# Patient Record
Sex: Male | Born: 1975 | Race: White | Hispanic: No | Marital: Married | Smoking: Former smoker
Health system: Southern US, Community
[De-identification: ages and names within clinical notes are randomized; demographics above are authoritative.]

## PROBLEM LIST (undated history)

## (undated) DIAGNOSIS — D179 Benign lipomatous neoplasm, unspecified: Secondary | ICD-10-CM

## (undated) DIAGNOSIS — Z6825 Body mass index (BMI) 25.0-25.9, adult: Secondary | ICD-10-CM

## (undated) DIAGNOSIS — R079 Chest pain, unspecified: Secondary | ICD-10-CM

## (undated) DIAGNOSIS — K219 Gastro-esophageal reflux disease without esophagitis: Secondary | ICD-10-CM

## (undated) DIAGNOSIS — I10 Essential (primary) hypertension: Secondary | ICD-10-CM

## (undated) DIAGNOSIS — E7800 Pure hypercholesterolemia, unspecified: Secondary | ICD-10-CM

## (undated) DIAGNOSIS — T7840XA Allergy, unspecified, initial encounter: Secondary | ICD-10-CM

## (undated) DIAGNOSIS — F419 Anxiety disorder, unspecified: Secondary | ICD-10-CM

## (undated) DIAGNOSIS — G588 Other specified mononeuropathies: Secondary | ICD-10-CM

## (undated) HISTORY — PX: HERNIA REPAIR: SHX51

## (undated) HISTORY — DX: Other specified mononeuropathies: G58.8

## (undated) HISTORY — DX: Pure hypercholesterolemia, unspecified: E78.00

## (undated) HISTORY — DX: Allergy, unspecified, initial encounter: T78.40XA

## (undated) HISTORY — DX: Chest pain, unspecified: R07.9

## (undated) HISTORY — DX: Body mass index (BMI) 25.0-25.9, adult: Z68.25

## (undated) HISTORY — DX: Gastro-esophageal reflux disease without esophagitis: K21.9

## (undated) HISTORY — DX: Essential (primary) hypertension: I10

## (undated) HISTORY — DX: Anxiety disorder, unspecified: F41.9

---

## 2004-10-10 ENCOUNTER — Ambulatory Visit: Payer: Self-pay | Admitting: Family Medicine

## 2004-11-11 ENCOUNTER — Ambulatory Visit: Payer: Self-pay | Admitting: Family Medicine

## 2004-11-27 ENCOUNTER — Ambulatory Visit: Payer: Self-pay | Admitting: Family Medicine

## 2016-02-15 ENCOUNTER — Other Ambulatory Visit: Payer: Self-pay | Admitting: Urology

## 2016-02-15 DIAGNOSIS — N50811 Right testicular pain: Secondary | ICD-10-CM

## 2016-02-22 ENCOUNTER — Ambulatory Visit
Admission: RE | Admit: 2016-02-22 | Discharge: 2016-02-22 | Disposition: A | Discharge status: Home / Self Care | Payer: BC Managed Care – PPO | Source: Ambulatory Visit | Attending: Urology | Admitting: Urology

## 2016-02-22 DIAGNOSIS — N50811 Right testicular pain: Secondary | ICD-10-CM

## 2016-10-23 ENCOUNTER — Other Ambulatory Visit: Payer: Self-pay | Admitting: Urology

## 2016-10-23 DIAGNOSIS — D3000 Benign neoplasm of unspecified kidney: Secondary | ICD-10-CM

## 2016-10-25 ENCOUNTER — Ambulatory Visit (HOSPITAL_COMMUNITY)
Admission: RE | Admit: 2016-10-25 | Discharge: 2016-10-25 | Disposition: A | Discharge status: Home / Self Care | Payer: BC Managed Care – PPO | Source: Ambulatory Visit | Attending: Urology | Admitting: Urology

## 2016-10-25 ENCOUNTER — Encounter (HOSPITAL_COMMUNITY): Payer: Self-pay

## 2016-10-25 DIAGNOSIS — D3000 Benign neoplasm of unspecified kidney: Secondary | ICD-10-CM

## 2016-10-30 ENCOUNTER — Other Ambulatory Visit: Payer: Self-pay | Admitting: Urology

## 2016-10-30 DIAGNOSIS — N50811 Right testicular pain: Secondary | ICD-10-CM

## 2016-11-03 ENCOUNTER — Ambulatory Visit: Payer: Self-pay | Admitting: Surgery

## 2016-11-03 NOTE — H&P (Signed)
Sergio Moore 11/03/2016 10:22 AM Location: [facility] Patient #: [id number] DOB: [birth year] Married / Language: Cleophus Molt / Race: White Male  History of Present Illness Adin Hector MD; 11/03/2016 1:49 PM) The patient is a 41 year old male who presents with inguinal pain. Note for "Inguinal pain": Patient sent for surgical evaluation by urologist Dr. Irine Seal. [facility]. Concern for chronic right groin pain.  Pleasant 41 year old male. Works as a Emergency planning/management officer. He underwent laparoscopic repair of symptomatic bilateral inguinal hernias by Dr Kendell Bane in 2016 Gantt know location on the on the left, but right apparently was indirect inguinal according to the operative report. Sounds like they used a Bard preformed mesh. Titanium spiral tacks were used to hold the meshes in place.   Patient has had no pain or symptoms on the left side. However, he struggles with moderately severe chronic right groin pain ever since surgery. Describes it is rather sharp in his groin radiating to his testicle. NO relief with Ice / Heat. He is tried numerous nonsteroidals including Mobic for almost 2 months at a time without much help. Was sent to physical therapy with [facility]. Urology evaluation to rule out epididymitis or other problems. Has had local injections he recalls at least 4 times along the ilioinguinal nerve. He cannot tolerate amitriptyline due to sleepiness and gabapentin due to sleepiness and nightmares. No help with muscle relaxants. Tried to avoid use of narcotics but occasionally uses tramadol. Testicle very sensitive. Based on concerns he wished to get a second opinion on management of this. Sent for surgical evaluation.  Does not smoke. No chronic back pain. Usually moves his bowels every day. Walks rather regularly. Promoted to surgeon so 75% of his work does desk work. Still he occasionally has to be on the field.  Worse when he has prolonged driving or sitting episodes. Also cannot lie down flat secondary the right groin stretching and hurting. Again no improvement with physical therapy or other interventions above.   Past Surgical History (April Staton, [place]; 11/03/2016 10:23 AM) Laparoscopic Inguinal Hernia Surgery Bilateral.  Diagnostic Studies History (April Staton, [place]; 11/03/2016 10:23 AM) Colonoscopy never  Allergies (April Staton, CMA; 11/03/2016 10:24 AM) No Known Drug Allergies 11/03/2016  Medication History (April Staton, CMA; 11/03/2016 10:25 AM) ALPRAZolam (1MG  Tablet, Oral) Active. TraMADol HCl (50MG  Tablet, Oral) Active. LamoTRIgine (100MG  Tablet, Oral) Active. Medications Reconciled  Social History (April Staton, CMA; 11/03/2016 10:23 AM) Alcohol use Occasional alcohol use. Caffeine use Coffee. No drug use Tobacco use Former smoker.  Family History (April Staton, [place]; 11/03/2016 10:23 AM) Diabetes Mellitus Father, Mother. Thyroid problems Father.  Other Problems (April Staton, CMA; 11/03/2016 10:23 AM) Anxiety Disorder Back Pain Hemorrhoids Inguinal Hernia     Review of Systems (April Staton CMA; 11/03/2016 10:23 AM) General Not Present- Appetite Loss, Chills, Fatigue, Fever, Night Sweats, Weight Gain and Weight Loss. Skin Not Present- Change in Wart/Mole, Dryness, Hives, Jaundice, New Lesions, Non-Healing Wounds, Rash and Ulcer. HEENT Present- Ringing in the Ears. Not Present- Earache, Hearing Loss, Hoarseness, Nose Bleed, Oral Ulcers, Seasonal Allergies, Sinus Pain, Sore Throat, Visual Disturbances, Wears glasses/contact lenses and Yellow Eyes. Respiratory Not Present- Bloody sputum, Chronic Cough, Difficulty Breathing, Snoring and Wheezing. Breast Not Present- Breast Mass, Breast Pain, Nipple Discharge and Skin Changes. Cardiovascular Not Present- Chest Pain, Difficulty Breathing Lying Down, Leg Cramps, Palpitations, Rapid Heart Rate, Shortness of Breath and  Swelling of Extremities. Gastrointestinal Present- Hemorrhoids. Not Present- Abdominal Pain, Bloating, Bloody  Stool, Change in Bowel Habits, Chronic diarrhea, Constipation, Difficulty Swallowing, Excessive gas, Gets full quickly at meals, Indigestion, Nausea, Rectal Pain and Vomiting. Male Genitourinary Present- Change in Urinary Stream. Not Present- Blood in Urine, Frequency, Impotence, Nocturia, Painful Urination, Urgency and Urine Leakage. Musculoskeletal Present- Back Pain. Not Present- Joint Pain, Joint Stiffness, Muscle Pain, Muscle Weakness and Swelling of Extremities. Neurological Not Present- Decreased Memory, Fainting, Headaches, Numbness, Seizures, Tingling, Tremor, Trouble walking and Weakness. Psychiatric Present- Anxiety. Not Present- Bipolar, Change in Sleep Pattern, Depression, Fearful and Frequent crying. Endocrine Not Present- Cold Intolerance, Excessive Hunger, Hair Changes, Heat Intolerance, Hot flashes and New Diabetes. Hematology Present- Gland problems. Not Present- Blood Thinners, Easy Bruising, Excessive bleeding, HIV and Persistent Infections.  Vitals (April Staton CMA; 11/03/2016 10:25 AM) 11/03/2016 10:25 AM Weight: 217.38 lb Height: 72in Body Surface Area: 2.21 m Body Mass Index: 29.48 kg/m  Temp.: 98.75F(Oral)  Pulse: 71 (Regular)  P.OX: 98% (Room air) BP: 132/88 (Sitting, Left Arm, Standard)      Physical Exam Adin Hector MD; 11/03/2016 1:50 PM)  General Mental Status-Alert. General Appearance-Not in acute distress, Not Sickly. Orientation-Oriented X3. Hydration-Well hydrated. Voice-Normal.  Integumentary Global Assessment Upon inspection and palpation of skin surfaces of the - Axillae: non-tender, no inflammation or ulceration, no drainage. and Distribution of scalp and body hair is normal. General Characteristics Temperature - normal warmth is noted.  Head and Neck Head-normocephalic, atraumatic with no lesions or palpable  masses. Face Global Assessment - atraumatic, no absence of expression. Neck Global Assessment - no abnormal movements, no bruit auscultated on the right, no bruit auscultated on the left, no decreased range of motion, non-tender. Trachea-midline. Thyroid Gland Characteristics - non-tender.  Eye Eyeball - Left-Extraocular movements intact, No Nystagmus. Eyeball - Right-Extraocular movements intact, No Nystagmus. Cornea - Left-No Hazy. Cornea - Right-No Hazy. Sclera/Conjunctiva - Left-No scleral icterus, No Discharge. Sclera/Conjunctiva - Right-No scleral icterus, No Discharge. Pupil - Left-Direct reaction to light normal. Pupil - Right-Direct reaction to light normal.  ENMT Ears Pinna - Left - no drainage observed, no generalized tenderness observed. Right - no drainage observed, no generalized tenderness observed. Nose and Sinuses External Inspection of the Nose - no destructive lesion observed. Inspection of the nares - Left - quiet respiration. Right - quiet respiration. Mouth and Throat Lips - Upper Lip - no fissures observed, no pallor noted. Lower Lip - no fissures observed, no pallor noted. Nasopharynx - no discharge present. Oral Cavity/Oropharynx - Tongue - no dryness observed. Oral Mucosa - no cyanosis observed. Hypopharynx - no evidence of airway distress observed.  Chest and Lung Exam Inspection Movements - Normal and Symmetrical. Accessory muscles - No use of accessory muscles in breathing. Palpation Palpation of the chest reveals - Non-tender. Auscultation Breath sounds - Normal and Clear.  Cardiovascular Auscultation Rhythm - Regular. Murmurs & Other Heart Sounds - Auscultation of the heart reveals - No Murmurs and No Systolic Clicks.  Abdomen Inspection Inspection of the abdomen reveals - No Visible peristalsis and No Abnormal pulsations. Umbilicus - No Bleeding, No Urine drainage. Palpation/Percussion Palpation and Percussion of the abdomen  reveal - Soft, Non Tender, No Rebound tenderness, No Rigidity (guarding) and No Cutaneous hyperesthesia. Note: Abdomen soft. Nontender, nondistended. No guarding. No diastasis. No umbilical nor other hernias  Male Genitourinary Sexual Maturity Tanner 5 - Adult hair pattern and Adult penile size and shape. Note: Very sensitive along the right external ring & pubic along cord & nerves. Very sensitive on right testicle as well. Some  symptoms and sensitivity superior medially to the anterior superior iliac spine on the right close to where the ilioinguinal nerve injections took place.   No inguinal hernias. Normal external genitalia. Epididymi, testes, and spermatic cords normal without any masses.  Peripheral Vascular Upper Extremity Inspection - Left - No Cyanotic nailbeds, Not Ischemic. Right - No Cyanotic nailbeds, Not Ischemic.  Neurologic Neurologic evaluation reveals -normal attention span and ability to concentrate, able to name objects and repeat phrases. Appropriate fund of knowledge , normal sensation and normal coordination. Mental Status Affect - not angry, not paranoid. Cranial Nerves-Normal Bilaterally. Gait-Normal.  Neuropsychiatric Mental status exam performed with findings of-able to articulate well with normal speech/language, rate, volume and coherence, thought content normal with ability to perform basic computations and apply abstract reasoning and no evidence of hallucinations, delusions, obsessions or homicidal/suicidal ideation.  Musculoskeletal Global Assessment Spine, Ribs and Pelvis - no instability, subluxation or laxity. Right Upper Extremity - no instability, subluxation or laxity.  Lymphatic Head & Neck  General Head & Neck Lymphatics: Bilateral - Description - No Localized lymphadenopathy. Axillary  General Axillary Region: Bilateral - Description - No Localized lymphadenopathy. Femoral & Inguinal  Generalized Femoral & Inguinal Lymphatics:  Left - Description - No Localized lymphadenopathy. Right - Description - No Localized lymphadenopathy.    Assessment & Plan Adin Hector MD; 11/03/2016 1:52 PM)  GROIN PAIN, CHRONIC, RIGHT (R10.31) Impression: Severe right groin pain that seems to focus right at the genitofemoral and ilioinguinal nerves. Most likely due to metal tacks with mesh repair.  There are no easy solutions here.  Discussed with my colleagues, Dr. Rosendo Gros and Dr. Brantley Stage. The patient has exhausted his options with trial of anti-inflammatories, muscle relaxants, neuropathic pain medications. He cannot tolerate the latter. Pelvic floor physical therapy. No improvement with local nerve injections at least 4 times. No evidence of hernia recurrence by CAT scan. No evidence of urological problems on that side  The only other option left is to explore laparoscopically vs open. Most likely a TAPP vs TEP approach. Locate and excise the mesh & untwist/remove all tacks on that side. We the left side alone as it is asymptomatic. Replace with a thin sheet of mesh without tacks. Triple neurectomy lap versus open assisted. Give a chance to help release a trapped or chronically irritated nerve  The biggest issue is that it may not solve his problems or make things worse. However, he has exhausted other options with interventions by numerous specialists. He would like to proceed.  PREOP - ING HERNIA - ENCOUNTER FOR PREOPERATIVE EXAMINATION FOR GENERAL SURGICAL PROCEDURE (Z01.818)  Current Plans You are being scheduled for surgery- Our schedulers will call you.  You should hear from our office's scheduling department within 5 working days about the location, date, and time of surgery. We try to make accommodations for patient's preferences in scheduling surgery, but sometimes the OR schedule or the surgeon's schedule prevents [place] from making those accommodations.  If you have not heard from our office [phone]) in 5 working  days, call the office and ask for your surgeon's nurse.  If you have other questions about your diagnosis, plan, or surgery, call the office and ask for your surgeon's nurse.  Written instructions provided The anatomy & physiology of the abdominal wall and pelvic floor was discussed. The pathophysiology of hernias in the inguinal and pelvic region was discussed. Natural history risks such as progressive enlargement, pain, incarceration, and strangulation was discussed. Contributors to complications such as smoking,  obesity, diabetes, prior surgery, etc were discussed.  I feel the risks of no intervention will lead to serious problems that outweigh the operative risks; therefore, I recommended surgery to reduce and repair the hernia. I explained laparoscopic techniques with possible need for an open approach. I noted usual use of mesh to patch and/or buttress hernia repair  Risks such as bleeding, infection, abscess, need for further treatment, heart attack, death, and other risks were discussed. I noted a fair to good likelihood this will help address the problem. He understands this may not help and may even worsen. However he has exhausted other options. Goals of post-operative recovery were discussed as well. Possibility that this will not correct all symptoms was explained. I stressed the importance of low-impact activity, aggressive pain control, avoiding constipation, & not pushing through pain to minimize risk of post-operative chronic pain or injury. Possibility of reherniation was discussed. We will work to minimize complications.  An educational handout further explaining the pathology & treatment options was given as well. Questions were answered. The patient expresses understanding & wishes to proceed with surgery.  Pt Education - Pamphlet Given - Laparoscopic Hernia Repair: discussed with patient and provided information. Pt Education - [facility] Pain Control (Linh Johannes) Pt  Education - [facility] Hernia Post-Op HCI (Jhair Witherington): discussed with patient and provided information.

## 2016-11-04 ENCOUNTER — Other Ambulatory Visit: Payer: Self-pay | Admitting: Urology

## 2016-11-04 ENCOUNTER — Ambulatory Visit (HOSPITAL_COMMUNITY)
Admission: RE | Admit: 2016-11-04 | Discharge: 2016-11-04 | Disposition: A | Discharge status: Home / Self Care | Payer: BC Managed Care – PPO | Source: Ambulatory Visit | Attending: Urology | Admitting: Urology

## 2016-11-04 ENCOUNTER — Other Ambulatory Visit (HOSPITAL_COMMUNITY): Payer: BC Managed Care – PPO

## 2016-11-04 DIAGNOSIS — N50811 Right testicular pain: Secondary | ICD-10-CM | POA: Diagnosis present

## 2016-11-04 DIAGNOSIS — D3000 Benign neoplasm of unspecified kidney: Secondary | ICD-10-CM | POA: Diagnosis present

## 2016-11-04 DIAGNOSIS — N281 Cyst of kidney, acquired: Secondary | ICD-10-CM | POA: Insufficient documentation

## 2016-11-04 MED ORDER — GADOBENATE DIMEGLUMINE 529 MG/ML IV SOLN
20.0000 mL | Freq: Once | INTRAVENOUS | Status: AC | PRN
  Administered 2016-11-04: 19 mL via INTRAVENOUS

## 2016-12-15 NOTE — Patient Instructions (Addendum)
Sergio Moore  12/15/2016   Your procedure is scheduled on: 12/18/16  Report to [facility] Main  Entrance            Take [place]  elevators to 3rd floor to  [place] at    [place] AM.     Call this number if you have problems the morning of surgery [phone]    Remember: ONLY 1 PERSON MAY GO WITH YOU TO SHORT STAY TO GET  READY MORNING OF [place].  Do not eat food or drink liquids :After Midnight.     Take these medicines the morning of surgery with A SIP OF WATER: zantac, xanax, lamotrigine (lamictal)                                You may not have any metal on your body including hair pins and              piercings  Do not wear jewelry,  lotions, powders or perfumes, deodorant                          Men may shave face and neck.   Do not bring valuables to the hospital. [place].  Contacts, dentures or bridgework may not be worn into surgery.      Patients discharged the day of surgery will not be allowed to drive home.  Name and phone number of your driver:  Special Instructions: N/A              Please read over the following fact sheets you were given: _____________________________________________________________________             [facility] - Preparing for Surgery Before surgery, you can play an important role.  Because skin is not sterile, your skin needs to be as free of germs as possible.  You can reduce the number of germs on your skin by washing with CHG (chlorahexidine gluconate) soap before surgery.  CHG is an antiseptic cleaner which kills germs and bonds with the skin to continue killing germs even after washing. Please DO NOT use if you have an allergy to CHG or antibacterial soaps.  If your skin becomes reddened/irritated stop using the CHG and inform your nurse when you arrive at Short Stay. Do not shave (including legs and underarms) for at least 48 hours prior to  the first CHG shower.  You may shave your face/neck. Please follow these instructions carefully:  1.  Shower with CHG Soap the night before surgery and the  morning of Surgery.  2.  If you choose to wash your hair, wash your hair first as usual with your  normal  shampoo.  3.  After you shampoo, rinse your hair and body thoroughly to remove the  shampoo.                           4.  Use CHG as you would any other liquid soap.  You can apply chg directly  to the skin and wash  Gently with a scrungie or clean washcloth.  5.  Apply the CHG Soap to your body ONLY FROM THE NECK DOWN.   Do not use on face/ open                           Wound or open sores. Avoid contact with eyes, ears mouth and genitals (private parts).                       Wash face,  Genitals (private parts) with your normal soap.             6.  Wash thoroughly, paying special attention to the area where your surgery  will be performed.  7.  Thoroughly rinse your body with warm water from the neck down.  8.  DO NOT shower/wash with your normal soap after using and rinsing off  the CHG Soap.                9.  Pat yourself dry with a clean towel.            10.  Wear clean pajamas.            11.  Place clean sheets on your bed the night of your first shower and do not  sleep with pets. Day of Surgery : Do not apply any lotions/deodorants the morning of surgery.  Please wear clean clothes to the hospital/surgery center.  FAILURE TO FOLLOW THESE INSTRUCTIONS MAY RESULT IN THE CANCELLATION OF YOUR SURGERY PATIENT SIGNATURE_________________________________  NURSE SIGNATURE__________________________________  ________________________________________________________________________

## 2016-12-16 ENCOUNTER — Encounter (HOSPITAL_COMMUNITY)
Admission: RE | Admit: 2016-12-16 | Discharge: 2016-12-16 | Disposition: A | Discharge status: Home / Self Care | Payer: BC Managed Care – PPO | Source: Ambulatory Visit | Attending: Surgery | Admitting: Surgery

## 2016-12-16 ENCOUNTER — Encounter (HOSPITAL_COMMUNITY): Payer: Self-pay

## 2016-12-16 DIAGNOSIS — Z87891 Personal history of nicotine dependence: Secondary | ICD-10-CM | POA: Diagnosis not present

## 2016-12-16 DIAGNOSIS — G8929 Other chronic pain: Secondary | ICD-10-CM | POA: Diagnosis not present

## 2016-12-16 DIAGNOSIS — R1031 Right lower quadrant pain: Secondary | ICD-10-CM | POA: Diagnosis present

## 2016-12-16 DIAGNOSIS — Z79899 Other long term (current) drug therapy: Secondary | ICD-10-CM | POA: Diagnosis not present

## 2016-12-16 DIAGNOSIS — K660 Peritoneal adhesions (postprocedural) (postinfection): Secondary | ICD-10-CM | POA: Diagnosis not present

## 2016-12-16 DIAGNOSIS — K4090 Unilateral inguinal hernia, without obstruction or gangrene, not specified as recurrent: Secondary | ICD-10-CM | POA: Diagnosis not present

## 2016-12-16 DIAGNOSIS — I10 Essential (primary) hypertension: Secondary | ICD-10-CM | POA: Diagnosis not present

## 2016-12-16 DIAGNOSIS — F419 Anxiety disorder, unspecified: Secondary | ICD-10-CM | POA: Diagnosis not present

## 2016-12-16 HISTORY — DX: Essential (primary) hypertension: I10

## 2016-12-16 HISTORY — DX: Benign lipomatous neoplasm, unspecified: D17.9

## 2016-12-16 LAB — BASIC METABOLIC PANEL
ANION GAP: 8 (ref 5–15)
BUN: 13 mg/dL (ref 6–20)
CHLORIDE: 104 mmol/L (ref 101–111)
CO2: 29 mmol/L (ref 22–32)
Calcium: 9.4 mg/dL (ref 8.9–10.3)
Creatinine, Ser: 1.08 mg/dL (ref 0.61–1.24)
GFR calc non Af Amer: >60 mL/min (ref >60)
GLUCOSE: 99 mg/dL (ref 65–99)
POTASSIUM: 4.7 mmol/L (ref 3.5–5.1)
Sodium: 141 mmol/L (ref 135–145)

## 2016-12-16 LAB — CBC
HEMATOCRIT: 44.9 % (ref 39.0–52.0)
HEMOGLOBIN: 15.6 g/dL (ref 13.0–17.0)
MCH: 30.1 pg (ref 26.0–34.0)
MCHC: 34.7 g/dL (ref 30.0–36.0)
MCV: 86.7 fL (ref 78.0–100.0)
Platelets: 175 10*3/uL (ref 150–400)
RBC: 5.18 MIL/uL (ref 4.22–5.81)
RDW: 12.3 % (ref 11.5–15.5)
WBC: 5.2 10*3/uL (ref 4.0–10.5)

## 2016-12-17 NOTE — Anesthesia Preprocedure Evaluation (Addendum)
Anesthesia Evaluation  Patient identified by MRN, date of birth, ID band Patient awake    Reviewed: Allergy & Precautions, NPO status , Patient's Chart, lab work & pertinent test results  History of Anesthesia Complications Negative for: history of anesthetic complications  Airway Mallampati: II  TM Distance: >3 FB Neck ROM: Full    Dental no notable dental hx. (+) Dental Advisory Given   Pulmonary former smoker,    Pulmonary exam normal        Cardiovascular hypertension, Normal cardiovascular exam     Neuro/Psych negative neurological ROS  negative psych ROS   GI/Hepatic Neg liver ROS,   Endo/Other  negative endocrine ROS  Renal/GU negative Renal ROS     Musculoskeletal negative musculoskeletal ROS (+)   Abdominal   Peds  Hematology negative hematology ROS (+)   Anesthesia Other Findings Day of surgery medications reviewed with the patient.  Reproductive/Obstetrics                            Anesthesia Physical Anesthesia Plan  ASA: II  Anesthesia Plan: General   Post-op Pain Management:    Induction: Intravenous  Airway Management Planned: Oral ETT  Additional Equipment:   Intra-op Plan:   Post-operative Plan: Extubation in OR  Informed Consent: I have reviewed the patients History and Physical, chart, labs and discussed the procedure including the risks, benefits and alternatives for the proposed anesthesia with the patient or authorized representative who has indicated his/her understanding and acceptance.   Dental advisory given  Plan Discussed with: CRNA and Anesthesiologist  Anesthesia Plan Comments:        Anesthesia Quick Evaluation

## 2016-12-18 ENCOUNTER — Ambulatory Visit (HOSPITAL_COMMUNITY): Payer: BC Managed Care – PPO | Admitting: Anesthesiology

## 2016-12-18 ENCOUNTER — Encounter (HOSPITAL_COMMUNITY): Payer: Self-pay | Admitting: *Deleted

## 2016-12-18 ENCOUNTER — Encounter (HOSPITAL_COMMUNITY)
Admission: RE | Disposition: A | Discharge status: Home / Self Care | Payer: Self-pay | Source: Ambulatory Visit | Attending: Surgery

## 2016-12-18 ENCOUNTER — Ambulatory Visit (HOSPITAL_COMMUNITY)
Admission: RE | Admit: 2016-12-18 | Discharge: 2016-12-18 | Disposition: A | Discharge status: Home / Self Care | Payer: BC Managed Care – PPO | Source: Ambulatory Visit | Attending: Surgery | Admitting: Surgery

## 2016-12-18 DIAGNOSIS — Z87891 Personal history of nicotine dependence: Secondary | ICD-10-CM | POA: Insufficient documentation

## 2016-12-18 DIAGNOSIS — Z9889 Other specified postprocedural states: Secondary | ICD-10-CM

## 2016-12-18 DIAGNOSIS — R1031 Right lower quadrant pain: Secondary | ICD-10-CM

## 2016-12-18 DIAGNOSIS — G8929 Other chronic pain: Secondary | ICD-10-CM | POA: Insufficient documentation

## 2016-12-18 DIAGNOSIS — Z8719 Personal history of other diseases of the digestive system: Secondary | ICD-10-CM

## 2016-12-18 DIAGNOSIS — K4090 Unilateral inguinal hernia, without obstruction or gangrene, not specified as recurrent: Secondary | ICD-10-CM | POA: Insufficient documentation

## 2016-12-18 DIAGNOSIS — Z79899 Other long term (current) drug therapy: Secondary | ICD-10-CM | POA: Insufficient documentation

## 2016-12-18 DIAGNOSIS — I10 Essential (primary) hypertension: Secondary | ICD-10-CM | POA: Insufficient documentation

## 2016-12-18 DIAGNOSIS — F419 Anxiety disorder, unspecified: Secondary | ICD-10-CM | POA: Insufficient documentation

## 2016-12-18 DIAGNOSIS — K660 Peritoneal adhesions (postprocedural) (postinfection): Secondary | ICD-10-CM | POA: Insufficient documentation

## 2016-12-18 HISTORY — DX: Personal history of other diseases of the digestive system: Z87.19

## 2016-12-18 HISTORY — PX: OTHER SURGICAL HISTORY: SHX169

## 2016-12-18 HISTORY — PX: LAPAROSCOPIC LYSIS OF ADHESIONS: SHX5905

## 2016-12-18 HISTORY — PX: INGUINAL HERNIA REPAIR: SHX194

## 2016-12-18 HISTORY — PX: LAPAROSCOPIC INGUINAL HERNIA REPAIR: SUR788

## 2016-12-18 HISTORY — DX: Right lower quadrant pain: R10.31

## 2016-12-18 HISTORY — PX: INSERTION OF MESH: SHX5868

## 2016-12-18 SURGERY — REPAIR, HERNIA, INGUINAL, BILATERAL, LAPAROSCOPIC
Anesthesia: General

## 2016-12-18 MED ORDER — BUPIVACAINE LIPOSOME 1.3 % IJ SUSP
20.0000 mL | Freq: Once | INTRAMUSCULAR | Status: AC
  Administered 2016-12-18: 20 mL
  Filled 2016-12-18: qty 20

## 2016-12-18 MED ORDER — CELECOXIB 200 MG PO CAPS
400.0000 mg | ORAL_CAPSULE | ORAL | Status: AC
  Administered 2016-12-18: 400 mg via ORAL
  Filled 2016-12-18: qty 2

## 2016-12-18 MED ORDER — LACTATED RINGERS IV SOLN
INTRAVENOUS | Status: DC | PRN
  Administered 2016-12-18 (×2): via INTRAVENOUS

## 2016-12-18 MED ORDER — CEFAZOLIN SODIUM-DEXTROSE 2-4 GM/100ML-% IV SOLN
2.0000 g | INTRAVENOUS | Status: AC
  Administered 2016-12-18: 2 g via INTRAVENOUS

## 2016-12-18 MED ORDER — FENTANYL CITRATE (PF) 250 MCG/5ML IJ SOLN
INTRAMUSCULAR | Status: AC
  Filled 2016-12-18: qty 5

## 2016-12-18 MED ORDER — SUCCINYLCHOLINE CHLORIDE 200 MG/10ML IV SOSY
PREFILLED_SYRINGE | INTRAVENOUS | Status: AC
  Filled 2016-12-18: qty 10

## 2016-12-18 MED ORDER — TRAMADOL HCL 50 MG PO TABS: 50.0000 mg | ORAL_TABLET | Freq: Four times a day (QID) | ORAL | 0 refills | Status: DC | PRN

## 2016-12-18 MED ORDER — LIDOCAINE 2% (20 MG/ML) 5 ML SYRINGE
INTRAMUSCULAR | Status: AC
  Filled 2016-12-18: qty 10

## 2016-12-18 MED ORDER — ROCURONIUM BROMIDE 50 MG/5ML IV SOSY
PREFILLED_SYRINGE | INTRAVENOUS | Status: DC | PRN
  Administered 2016-12-18 (×3): 10 mg via INTRAVENOUS
  Administered 2016-12-18: 50 mg via INTRAVENOUS
  Administered 2016-12-18 (×6): 10 mg via INTRAVENOUS

## 2016-12-18 MED ORDER — DEXAMETHASONE SODIUM PHOSPHATE 10 MG/ML IJ SOLN
INTRAMUSCULAR | Status: AC
  Filled 2016-12-18: qty 1

## 2016-12-18 MED ORDER — CHLORHEXIDINE GLUCONATE CLOTH 2 % EX PADS: 6.0000 | MEDICATED_PAD | Freq: Once | CUTANEOUS | Status: DC

## 2016-12-18 MED ORDER — ONDANSETRON HCL 4 MG/2ML IJ SOLN
INTRAMUSCULAR | Status: DC | PRN
  Administered 2016-12-18: 4 mg via INTRAVENOUS

## 2016-12-18 MED ORDER — SODIUM CHLORIDE 0.9% FLUSH: 3.0000 mL | INTRAVENOUS | Status: DC | PRN

## 2016-12-18 MED ORDER — ROCURONIUM BROMIDE 50 MG/5ML IV SOSY
PREFILLED_SYRINGE | INTRAVENOUS | Status: AC
  Filled 2016-12-18: qty 5

## 2016-12-18 MED ORDER — OXYCODONE HCL 5 MG PO TABS
5.0000 mg | ORAL_TABLET | ORAL | Status: DC | PRN
  Administered 2016-12-18: 5 mg via ORAL
  Filled 2016-12-18: qty 1

## 2016-12-18 MED ORDER — CEFAZOLIN SODIUM-DEXTROSE 2-4 GM/100ML-% IV SOLN
INTRAVENOUS | Status: AC
  Filled 2016-12-18: qty 100

## 2016-12-18 MED ORDER — ONDANSETRON HCL 4 MG/2ML IJ SOLN
INTRAMUSCULAR | Status: AC
  Filled 2016-12-18: qty 2

## 2016-12-18 MED ORDER — PROPOFOL 10 MG/ML IV BOLUS
INTRAVENOUS | Status: DC | PRN
  Administered 2016-12-18: 200 mg via INTRAVENOUS

## 2016-12-18 MED ORDER — MIDAZOLAM HCL 2 MG/2ML IJ SOLN
INTRAMUSCULAR | Status: DC | PRN
  Administered 2016-12-18: 2 mg via INTRAVENOUS

## 2016-12-18 MED ORDER — BUPIVACAINE HCL (PF) 0.25 % IJ SOLN
INTRAMUSCULAR | Status: AC
  Filled 2016-12-18: qty 60

## 2016-12-18 MED ORDER — DEXAMETHASONE SODIUM PHOSPHATE 10 MG/ML IJ SOLN
INTRAMUSCULAR | Status: DC | PRN
  Administered 2016-12-18: 10 mg via INTRAVENOUS

## 2016-12-18 MED ORDER — ALPRAZOLAM 1 MG PO TABS
1.0000 mg | ORAL_TABLET | Freq: Two times a day (BID) | ORAL | Status: DC | PRN
  Administered 2016-12-18: 1 mg via ORAL
  Filled 2016-12-18 (×2): qty 1

## 2016-12-18 MED ORDER — DEXTROSE 5 % IV SOLN
1000.0000 mg | Freq: Four times a day (QID) | INTRAVENOUS | Status: DC | PRN
  Filled 2016-12-18: qty 10

## 2016-12-18 MED ORDER — PROMETHAZINE HCL 25 MG/ML IJ SOLN: 6.2500 mg | INTRAMUSCULAR | Status: DC | PRN

## 2016-12-18 MED ORDER — SCOPOLAMINE 1 MG/3DAYS TD PT72
1.0000 | MEDICATED_PATCH | TRANSDERMAL | Status: DC
  Administered 2016-12-18: 1.5 mg via TRANSDERMAL
  Filled 2016-12-18: qty 1

## 2016-12-18 MED ORDER — BUPIVACAINE-EPINEPHRINE (PF) 0.5% -1:200000 IJ SOLN
INTRAMUSCULAR | Status: AC
  Filled 2016-12-18: qty 60

## 2016-12-18 MED ORDER — GLYCOPYRROLATE 0.2 MG/ML IV SOSY
PREFILLED_SYRINGE | INTRAVENOUS | Status: AC
  Filled 2016-12-18: qty 5

## 2016-12-18 MED ORDER — BUPIVACAINE HCL (PF) 0.25 % IJ SOLN
INTRAMUSCULAR | Status: DC | PRN
  Administered 2016-12-18: 30 mL

## 2016-12-18 MED ORDER — SUGAMMADEX SODIUM 200 MG/2ML IV SOLN
INTRAVENOUS | Status: AC
  Filled 2016-12-18: qty 2

## 2016-12-18 MED ORDER — OXYCODONE HCL 5 MG PO TABS: 5.0000 mg | ORAL_TABLET | ORAL | 0 refills | Status: DC | PRN

## 2016-12-18 MED ORDER — PROPOFOL 10 MG/ML IV BOLUS
INTRAVENOUS | Status: AC
  Filled 2016-12-18: qty 20

## 2016-12-18 MED ORDER — SUGAMMADEX SODIUM 200 MG/2ML IV SOLN
INTRAVENOUS | Status: DC | PRN
  Administered 2016-12-18: 200 mg via INTRAVENOUS

## 2016-12-18 MED ORDER — FENTANYL CITRATE (PF) 250 MCG/5ML IJ SOLN
INTRAMUSCULAR | Status: DC | PRN
  Administered 2016-12-18: 100 ug via INTRAVENOUS
  Administered 2016-12-18 (×3): 50 ug via INTRAVENOUS

## 2016-12-18 MED ORDER — LIDOCAINE 2% (20 MG/ML) 5 ML SYRINGE
INTRAMUSCULAR | Status: DC | PRN
  Administered 2016-12-18: 100 mg via INTRAVENOUS

## 2016-12-18 MED ORDER — LIDOCAINE 2% (20 MG/ML) 5 ML SYRINGE
INTRAMUSCULAR | Status: DC | PRN
  Administered 2016-12-18: 1.5 mg/kg/h via INTRAVENOUS

## 2016-12-18 MED ORDER — HYDROMORPHONE HCL 2 MG/ML IJ SOLN: 0.2500 mg | INTRAMUSCULAR | Status: DC | PRN

## 2016-12-18 MED ORDER — GLYCOPYRROLATE 0.2 MG/ML IJ SOLN
INTRAMUSCULAR | Status: DC | PRN
  Administered 2016-12-18: 0.4 mg via INTRAVENOUS

## 2016-12-18 MED ORDER — ACETAMINOPHEN 500 MG PO TABS
1000.0000 mg | ORAL_TABLET | ORAL | Status: AC
  Administered 2016-12-18: 1000 mg via ORAL
  Filled 2016-12-18: qty 2

## 2016-12-18 MED ORDER — SODIUM CHLORIDE 0.9% FLUSH: 3.0000 mL | Freq: Two times a day (BID) | INTRAVENOUS | Status: DC

## 2016-12-18 MED ORDER — HYDROMORPHONE HCL 2 MG/ML IJ SOLN
INTRAMUSCULAR | Status: AC
  Filled 2016-12-18: qty 1

## 2016-12-18 MED ORDER — LIDOCAINE 2% (20 MG/ML) 5 ML SYRINGE
INTRAMUSCULAR | Status: AC
  Filled 2016-12-18: qty 5

## 2016-12-18 MED ORDER — NAPROXEN 500 MG PO TABS: 500.0000 mg | ORAL_TABLET | Freq: Two times a day (BID) | ORAL | 1 refills | Status: DC

## 2016-12-18 MED ORDER — GABAPENTIN 300 MG PO CAPS
300.0000 mg | ORAL_CAPSULE | ORAL | Status: AC
  Administered 2016-12-18: 300 mg via ORAL
  Filled 2016-12-18: qty 1

## 2016-12-18 MED ORDER — STERILE WATER FOR IRRIGATION IR SOLN
Status: DC | PRN
  Administered 2016-12-18: 1000 mL

## 2016-12-18 MED ORDER — ACETAMINOPHEN 325 MG PO TABS: 650.0000 mg | ORAL_TABLET | ORAL | Status: DC | PRN

## 2016-12-18 MED ORDER — HYDROMORPHONE HCL 2 MG/ML IJ SOLN
0.2500 mg | INTRAMUSCULAR | Status: AC | PRN
  Administered 2016-12-18 (×8): 0.5 mg via INTRAVENOUS

## 2016-12-18 MED ORDER — SODIUM CHLORIDE 0.9 % IV SOLN: 250.0000 mL | INTRAVENOUS | Status: DC | PRN

## 2016-12-18 MED ORDER — ACETAMINOPHEN 650 MG RE SUPP
650.0000 mg | RECTAL | Status: DC | PRN
  Filled 2016-12-18: qty 1

## 2016-12-18 MED ORDER — MIDAZOLAM HCL 2 MG/2ML IJ SOLN
INTRAMUSCULAR | Status: AC
  Filled 2016-12-18: qty 2

## 2016-12-18 MED ORDER — FENTANYL CITRATE (PF) 100 MCG/2ML IJ SOLN: 25.0000 ug | INTRAMUSCULAR | Status: DC | PRN

## 2016-12-18 MED ORDER — TRAMADOL HCL 50 MG PO TABS
50.0000 mg | ORAL_TABLET | Freq: Once | ORAL | Status: AC
  Administered 2016-12-18: 50 mg via ORAL
  Filled 2016-12-18: qty 1

## 2016-12-18 MED ORDER — METHOCARBAMOL 750 MG PO TABS: 750.0000 mg | ORAL_TABLET | Freq: Four times a day (QID) | ORAL | 2 refills | Status: DC | PRN

## 2016-12-18 SURGICAL SUPPLY — 33 items
APPLIER CLIP 5 13 M/L LIGAMAX5 (MISCELLANEOUS) ×4
CABLE HIGH FREQUENCY MONO STRZ (ELECTRODE) ×4 IMPLANT
CHLORAPREP W/TINT 26ML (MISCELLANEOUS) ×4 IMPLANT
CLIP APPLIE 5 13 M/L LIGAMAX5 (MISCELLANEOUS) ×2 IMPLANT
COVER SURGICAL LIGHT HANDLE (MISCELLANEOUS) ×4 IMPLANT
DECANTER SPIKE VIAL GLASS SM (MISCELLANEOUS) ×4 IMPLANT
DEVICE SECURE STRAP 25 ABSORB (INSTRUMENTS) IMPLANT
DRAPE WARM FLUID 44X44 (DRAPE) ×4 IMPLANT
DRSG TEGADERM 2-3/8X2-3/4 SM (GAUZE/BANDAGES/DRESSINGS) ×4 IMPLANT
DRSG TEGADERM 4X4.75 (GAUZE/BANDAGES/DRESSINGS) ×4 IMPLANT
ELECT REM PT RETURN 15FT ADLT (MISCELLANEOUS) ×4 IMPLANT
GAUZE SPONGE 2X2 8PLY STRL LF (GAUZE/BANDAGES/DRESSINGS) ×2 IMPLANT
GLOVE ECLIPSE 8.0 STRL XLNG CF (GLOVE) ×4 IMPLANT
GLOVE INDICATOR 8.0 STRL GRN (GLOVE) ×4 IMPLANT
GOWN STRL REUS W/TWL XL LVL3 (GOWN DISPOSABLE) ×8 IMPLANT
IRRIG SUCT STRYKERFLOW 2 WTIP (MISCELLANEOUS) ×4
IRRIGATION SUCT STRKRFLW 2 WTP (MISCELLANEOUS) ×2 IMPLANT
KIT BASIN OR (CUSTOM PROCEDURE TRAY) ×4 IMPLANT
MESH ULTRAPRO 6X6 15CM15CM (Mesh General) ×4 IMPLANT
NEEDLE INSUFFLATION 14GA 120MM (NEEDLE) IMPLANT
PAD POSITIONING PINK XL (MISCELLANEOUS) ×4 IMPLANT
SCISSORS LAP 5X35 DISP (ENDOMECHANICALS) ×4 IMPLANT
SLEEVE ADV FIXATION 5X100MM (TROCAR) ×4 IMPLANT
SPONGE GAUZE 2X2 STER 10/PKG (GAUZE/BANDAGES/DRESSINGS) ×2
SUT MNCRL AB 4-0 PS2 18 (SUTURE) ×4 IMPLANT
SUT VIC AB 2-0 SH 27 (SUTURE) ×2
SUT VIC AB 2-0 SH 27X BRD (SUTURE) ×2 IMPLANT
TACKER 5MM HERNIA 3.5CML NAB (ENDOMECHANICALS) IMPLANT
TOWEL OR 17X26 10 PK STRL BLUE (TOWEL DISPOSABLE) ×4 IMPLANT
TRAY LAPAROSCOPIC (CUSTOM PROCEDURE TRAY) ×4 IMPLANT
TROCAR ADV FIXATION 5X100MM (TROCAR) ×4 IMPLANT
TROCAR XCEL BLUNT TIP 100MML (ENDOMECHANICALS) ×4 IMPLANT
TUBING INSUF HEATED (TUBING) ×4 IMPLANT

## 2016-12-18 NOTE — Interval H&P Note (Signed)
History and Physical Interval Note:  12/18/2016 7:25 AM  Sergio Moore  has presented today for surgery, with the diagnosis of chronic right groin pain.  The various methods of treatment have been discussed with the patient and family. After consideration of risks, benefits and other options for treatment, the patient has consented to  Procedure(s): LAPAROSCOPIC RIGHT INGUINAL HERNIA POSSIBLE LEFT (Bilateral) INSERTION OF MESH (Bilateral) LAPAROSCOPIC LYSIS OF ADHESIONS WITH MESH REMOVAL (N/A) as a surgical intervention .  The patient's history has been reviewed, patient examined, no change in status, stable for surgery.  I have reviewed the patient's chart and labs.  Questions were answered to the patient's satisfaction.     Kendell Gammon C.

## 2016-12-18 NOTE — Anesthesia Postprocedure Evaluation (Addendum)
Anesthesia Post Note  Patient: Sergio Moore  Procedure(s) Performed: Procedure(s) (LRB): REDO RIGHT INGUINAL HERNIA, PERIPHERAL NEURECTOMY (Bilateral) INSERTION OF MESH (Bilateral) LAPAROSCOPIC LYSIS OF ADHESIONS WITH MESH AND TACK REMOVAL (N/A)  Patient location during evaluation: PACU Anesthesia Type: General Level of consciousness: sedated Pain management: pain level controlled Vital Signs Assessment: post-procedure vital signs reviewed and stable Respiratory status: spontaneous breathing and respiratory function stable Cardiovascular status: stable Anesthetic complications: no       Last Vitals:  Vitals:   12/18/16 1151 12/18/16 1200  BP:  (!) 147/100  Pulse: 84 81  Resp: 11 10  Temp:  36.8 C    Last Pain:  Vitals:   12/18/16 1200  TempSrc:   PainSc: 4                  Marijayne Rauth DANIEL

## 2016-12-18 NOTE — Discharge Instructions (Signed)
[facility] REPAIR: POST OP INSTRUCTIONS ° °###################################################################### ° °EAT °Gradually transition to a high fiber diet with a fiber supplement over the next few weeks after discharge.  Start with a pureed / full liquid diet (see below) ° °WALK °Walk an hour a day.  Control your pain to do that.   ° °CONTROL PAIN °Control pain so that you can walk, sleep, tolerate sneezing/coughing, go up/down stairs. ° °HAVE A BOWEL MOVEMENT DAILY °Keep your bowels regular to avoid problems.  OK to try a laxative to override constipation.  OK to use an antidairrheal to slow down diarrhea.  Call if not better after 2 tries ° °CALL IF YOU HAVE PROBLEMS/CONCERNS °Call if you are still struggling despite following these instructions. °Call if you have concerns not answered by these instructions ° °###################################################################### ° ° ° °1. DIET: Follow a light bland diet the first 24 hours after arrival home, such as soup, liquids, crackers, etc.  Be sure to include lots of fluids daily.  Avoid fast food or heavy meals as your are more likely to get nauseated.  Eat a low fat the next few days after surgery. °2. Take your usually prescribed home medications unless otherwise directed. °3. PAIN CONTROL: °a. Pain is best controlled by a usual combination of three different methods TOGETHER: °i. Ice/Heat °ii. Over the counter pain medication °iii. Prescription pain medication °b. Most patients will experience some swelling and bruising around the hernia(s) such as the bellybutton, groins, or old incisions.  Ice packs or heating pads (30-60 minutes up to 6 times a day) will help. Use ice for the first few days to help decrease swelling and bruising, then switch to heat to help relax tight/sore spots and speed recovery.  Some people prefer to use ice alone, heat alone, alternating between ice & heat.  Experiment to what works for you.  Swelling and bruising can take  several weeks to resolve.   °c. It is helpful to take an over-the-counter pain medication regularly for the first few weeks.  Choose one of the following that works best for you: °i. Naproxen (Aleve, etc)  Two 220mg tabs twice a day °ii. Ibuprofen (Advil, etc) Three 200mg tabs four times a day (every meal & bedtime) °iii. Acetaminophen (Tylenol, etc) 325-650mg four times a day (every meal & bedtime) °d. A  prescription for pain medication should be given to you upon discharge.  Take your pain medication as prescribed.  °i. If you are having problems/concerns with the prescription medicine (does not control pain, nausea, vomiting, rash, itching, etc), please call us [phone] to see if we need to switch you to a different pain medicine that will work better for you and/or control your side effect better. °ii. If you need a refill on your pain medication, please contact your pharmacy.  They will contact our office to request authorization. Prescriptions will not be filled after 5 pm or on week-ends. °4. Avoid getting constipated.  Between the surgery and the pain medications, it is common to experience some constipation.  Increasing fluid intake and taking a fiber supplement (such as Metamucil, Citrucel, FiberCon, MiraLax, etc) 1-2 times a day regularly will usually help prevent this problem from occurring.  A mild laxative (prune juice, Milk of Magnesia, MiraLax, etc) should be taken according to package directions if there are no bowel movements after 48 hours.   °5. Wash / shower every day.  You may shower over the dressings as they are waterproof.   °6. Remove   your waterproof bandages 5 days after surgery.  You may leave the incision open to air.  You may replace a dressing/Band-Aid to cover the incision for comfort if you wish.  Continue to shower over incision(s) after the dressing is off.    7. ACTIVITIES as tolerated:   a. You may resume regular (light) daily activities beginning the next day--such  as daily self-care, walking, climbing stairs--gradually increasing activities as tolerated.  If you can walk 30 minutes without difficulty, it is safe to try more intense activity such as jogging, treadmill, bicycling, low-impact aerobics, swimming, etc. b. Save the most intensive and strenuous activity for last such as sit-ups, heavy lifting, contact sports, etc  Refrain from any heavy lifting or straining until you are off narcotics for pain control.   c. DO NOT PUSH THROUGH PAIN.  Let pain be your guide: If it hurts to do something, don't do it.  Pain is your body warning you to avoid that activity for another week until the pain goes down. d. You may drive when you are no longer taking prescription pain medication, you can comfortably wear a seatbelt, and you can safely maneuver your car and apply brakes. e. Dennis Bast may have sexual intercourse when it is comfortable.  8. FOLLOW UP in our office a. Please call [facility] at [phone] to set up an appointment to see your surgeon in the office for a follow-up appointment approximately 2-3 weeks after your surgery. b. Make sure that you call for this appointment the day you arrive home to insure a convenient appointment time. 9.  IF YOU HAVE DISABILITY OR FAMILY LEAVE FORMS, BRING THEM TO THE OFFICE FOR PROCESSING.  DO NOT GIVE THEM TO YOUR DOCTOR.  WHEN TO CALL us [phone]: 1. Poor pain control 2. Reactions / problems with new medications (rash/itching, nausea, etc)  3. Fever over 101.5 F (38.5 C) 4. Inability to urinate 5. Nausea and/or vomiting 6. Worsening swelling or bruising 7. Continued bleeding from incision. 8. Increased pain, redness, or drainage from the incision   The clinic staff is available to answer your questions during regular business hours (8:30am-5pm).  Please dont hesitate to call and ask to speak to one of our nurses for clinical concerns.   If you have a medical emergency, go to the nearest emergency room or call  911.  A surgeon from [facility] is always on call at the hospitals in [facility], [place], [place], [place], [place]  [address] ?  [address] [address], [place], [place]   [address] MAIN: [phone] ? TOLL FREE: [phone] ? FAX: [phone] [email or web address]  Managing Pain  ######################################################################   CONTROL PAIN Control pain so that you can walk, sleep, tolerate sneezing/coughing, go up/down stairs.  (Good pain control is not pain free only when lying still, unable to move)  WALK Walk an hour a day.  Control your pain to do that.   HAVE A BOWEL MOVEMENT DAILY Keep your bowels regular to avoid problems.  OK to try a laxative to override constipation.  OK to use an antidairrheal to slow down diarrhea.  Call if not better after 2 tries  CALL IF YOU HAVE PROBLEMS/CONCERNS Call if you are still struggling despite following these instructions. Call if you have concerns not answered by these instructions  ######################################################################    Pain after surgery or related to activity is often due to strain/injury to muscle, tendon, nerves and/or incisions.  This  pain is usually short-term and will improve in a few months.   Many people find it helpful to do the following things TOGETHER to help speed the process of healing and to get back to regular activity more quickly:  1. Avoid heavy physical activity at first a. No lifting greater than 20 pounds at first, then increase to lifting as tolerated over the next few weeks b. Do not push through the pain.  Listen to your body and avoid positions and maneuvers than reproduce the pain.  Wait a few days before trying something more intense c. Walking is okay as tolerated, but go slowly and stop when getting sore.  If you can walk 30 minutes without stopping or pain, you can try more intense  activity (running, jogging, aerobics, cycling, swimming, treadmill, sex, sports, weightlifting, etc ) d. Remember: If it hurts to do it, then dont do it!  2. Take Anti-inflammatory medication a. Choose ONE of the following over-the-counter medications: i.            Acetaminophen 500mg  tabs (Tylenol) 1-2 pills with every meal and just before bedtime (avoid if you have liver problems) ii.            Naproxen 220mg  tabs (ex. Aleve) 1-2 pills twice a day (avoid if you have kidney, stomach, IBD, or bleeding problems) iii. Ibuprofen 200mg  tabs (ex. Advil, Motrin) 3-4 pills with every meal and just before bedtime (avoid if you have kidney, stomach, IBD, or bleeding problems) b. Take with food/snack around the clock for 1-2 weeks i. This helps the muscle and nerve tissues become less irritable and calm down faster  3. Use a Heating pad or Ice/Cold Pack a. 4-6 times a day b. May use warm bath/hottub  or showers  4. Try Gentle Massage and/or Stretching  a. at the area of pain many times a day b. stop if you feel pain - do not overdo it  Try these steps together to help you body heal faster and avoid making things get worse.  Doing just one of these things may not be enough.    If you are not getting better after two weeks or are noticing you are getting worse, contact our office for further advice; we may need to re-evaluate you & see what other things we can do to help.    General Anesthesia, Adult, Care After These instructions provide you with information about caring for yourself after your procedure. Your health care provider may also give you more specific instructions. Your treatment has been planned according to current medical practices, but problems sometimes occur. Call your health care provider if you have any problems or questions after your procedure. What can I expect after the procedure? After the procedure, it is common to have:  Vomiting.  A sore throat.  Mental  slowness. It is common to feel:  Nauseous.  Cold or shivery.  Sleepy.  Tired.  Sore or achy, even in parts of your body where you did not have surgery. Follow these instructions at home: For at least 24 hours after the procedure:   Do not:  Participate in activities where you could fall or become injured.  Drive.  Use heavy machinery.  Drink alcohol.  Take sleeping pills or medicines that cause drowsiness.  Make important decisions or sign legal documents.  Take care of children on your own.  Rest. Eating and drinking   If you vomit, drink water, juice, or soup when you can drink without  vomiting.  Drink enough fluid to keep your urine clear or pale yellow.  Make sure you have little or no nausea before eating solid foods.  Follow the diet recommended by your health care provider. General instructions   Have a responsible adult stay with you until you are awake and alert.  Return to your normal activities as told by your health care provider. Ask your health care provider what activities are safe for you.  Take over-the-counter and prescription medicines only as told by your health care provider.  If you smoke, do not smoke without supervision.  Keep all follow-up visits as told by your health care provider. This is important. Contact a health care provider if:  You continue to have nausea or vomiting at home, and medicines are not helpful.  You cannot drink fluids or start eating again.  You cannot urinate after 8-12 hours.  You develop a skin rash.  You have fever.  You have increasing redness at the site of your procedure. Get help right away if:  You have difficulty breathing.  You have chest pain.  You have unexpected bleeding.  You feel that you are having a life-threatening or urgent problem. This information is not intended to replace advice given to you by your health care provider. Make sure you discuss any questions you have with  your health care provider. Document Released: 11/24/2000 Document Revised: 01/21/2016 Document Reviewed: 08/02/2015 Elsevier Interactive Patient Education  2017 [employer].

## 2016-12-18 NOTE — Progress Notes (Signed)
No relief from tramadol. Requesting stronger meds for here and home. Refuses to take tramadol. Again assisted up to bathroom w/o success. Abd is not distended. Call into Dr Johney Maine. New orders and RX for home obtained

## 2016-12-18 NOTE — OR Nursing (Signed)
Dr. Johney Maine explanted mesh. Mesh has 4 tacks inside and 3 tacks that he removed freely.

## 2016-12-18 NOTE — Anesthesia Procedure Notes (Signed)
Procedure Name: Intubation Date/Time: 12/18/2016 7:39 AM Performed by: Dione Booze Pre-anesthesia Checklist: Emergency Drugs available, Suction available, Patient being monitored and Patient identified Patient Re-evaluated:Patient Re-evaluated prior to inductionOxygen Delivery Method: Circle system utilized Preoxygenation: Pre-oxygenation with 100% oxygen Intubation Type: IV induction Ventilation: Mask ventilation without difficulty Laryngoscope Size: Mac and 4 Grade View: Grade I Tube type: Oral Tube size: 7.5 mm Number of attempts: 1 Airway Equipment and Method: Stylet Placement Confirmation: ETT inserted through vocal cords under direct vision,  positive ETCO2 and breath sounds checked- equal and bilateral Secured at: 22 cm Tube secured with: Tape Dental Injury: Teeth and Oropharynx as per pre-operative assessment

## 2016-12-18 NOTE — H&P (Signed)
12/18/2016  Walker Shadow Sycamore 11/03/2016 10:22 AM Location: [facility] Patient #: [id number] DOB: October 23, 1975 Married / Language: Cleophus Molt / Race: White Male  Patient Care Team: Mateo Flow, MD as PCP - General (Family Medicine) Michael Boston, MD as Consulting Physician (General Surgery) Irine Seal, MD as Attending Physician (Urology) Kendell Bane, MD as Referring Physician (Surgery)   History of Present Illness The patient is a 41 year old male who presents with inguinal pain.   Patient sent for surgical evaluation by urologist Dr. Irine Seal. [facility]. Concern for chronic right groin pain.  Pleasant 41 year old male. Works as a Emergency planning/management officer. He underwent laparoscopic repair of symptomatic bilateral inguinal hernias by Dr Kendell Bane in 2016 [place] know location on the on the left, but right apparently was indirect inguinal according to the operative report. Sounds like they used a Bard preformed mesh. Titanium spiral tacks were used to hold the meshes in place.   Patient has had no pain or symptoms on the left side. However, he struggles with moderately severe chronic right groin pain ever since surgery. Describes it is rather sharp in his groin radiating to his testicle. NO relief with Ice / Heat. He is tried numerous nonsteroidals including Mobic for almost 2 months at a time without much help. Was sent to physical therapy with [facility]. Urology evaluation to rule out epididymitis or other problems. Has had local injections he recalls at least 4 times along the ilioinguinal nerve. He cannot tolerate amitriptyline due to sleepiness and gabapentin due to sleepiness and nightmares. No help with muscle relaxants. Tried to avoid use of narcotics but occasionally uses tramadol. Testicle very sensitive. Based on concerns he wished to get a second opinion on management of this. Sent for surgical evaluation.  Does not smoke.  No chronic back pain. Usually moves his bowels every day. Walks rather regularly. Promoted to surgeon so 75% of his work does desk work. Still he occasionally has to be on the field. Worse when he has prolonged driving or sitting episodes. Also cannot lie down flat secondary the right groin stretching and hurting. Again no improvement with physical therapy or other interventions above.  Ready for surgery     Past Surgical History (April Staton, [place]; 11/03/2016 10:23 AM) Laparoscopic Inguinal Hernia Surgery  Bilateral.  Diagnostic Studies History (April Staton, [place]; 11/03/2016 10:23 AM) Colonoscopy  never  Allergies (April Staton, CMA; 11/03/2016 10:24 AM) No Known Drug Allergies 11/03/2016  Medication History (April Staton, CMA; 11/03/2016 10:25 AM) ALPRAZolam ('1MG'$  Tablet, Oral) Active. TraMADol HCl ('50MG'$  Tablet, Oral) Active. LamoTRIgine ('100MG'$  Tablet, Oral) Active. Medications Reconciled  Social History (April Staton, CMA; 11/03/2016 10:23 AM) Alcohol use  Occasional alcohol use. Caffeine use  Coffee. No drug use  Tobacco use  Former smoker.  Family History (April Staton, [place]; 11/03/2016 10:23 AM) Diabetes Mellitus  Father, Mother. Thyroid problems  Father.  Other Problems (April Staton, CMA; 11/03/2016 10:23 AM) Anxiety Disorder  Back Pain  Hemorrhoids  Inguinal Hernia     Review of Systems (April Staton CMA; 11/03/2016 10:23 AM) General Not Present- Appetite Loss, Chills, Fatigue, Fever, Night Sweats, Weight Gain and Weight Loss. Skin Not Present- Change in Wart/Mole, Dryness, Hives, Jaundice, New Lesions, Non-Healing Wounds, Rash and Ulcer. HEENT Present- Ringing in the Ears. Not Present- Earache, Hearing Loss, Hoarseness, Nose Bleed, Oral Ulcers, Seasonal Allergies, Sinus Pain, Sore Throat, Visual Disturbances, Wears glasses/contact lenses and Yellow Eyes. Respiratory Not Present- Bloody sputum, Chronic Cough, Difficulty Breathing,  Snoring and  Wheezing. Breast Not Present- Breast Mass, Breast Pain, Nipple Discharge and Skin Changes. Cardiovascular Not Present- Chest Pain, Difficulty Breathing Lying Down, Leg Cramps, Palpitations, Rapid Heart Rate, Shortness of Breath and Swelling of Extremities. Gastrointestinal Present- Hemorrhoids. Not Present- Abdominal Pain, Bloating, Bloody Stool, Change in Bowel Habits, Chronic diarrhea, Constipation, Difficulty Swallowing, Excessive gas, Gets full quickly at meals, Indigestion, Nausea, Rectal Pain and Vomiting. Male Genitourinary Present- Change in Urinary Stream. Not Present- Blood in Urine, Frequency, Impotence, Nocturia, Painful Urination, Urgency and Urine Leakage. Musculoskeletal Present- Back Pain. Not Present- Joint Pain, Joint Stiffness, Muscle Pain, Muscle Weakness and Swelling of Extremities. Neurological Not Present- Decreased Memory, Fainting, Headaches, Numbness, Seizures, Tingling, Tremor, Trouble walking and Weakness. Psychiatric Present- Anxiety. Not Present- Bipolar, Change in Sleep Pattern, Depression, Fearful and Frequent crying. Endocrine Not Present- Cold Intolerance, Excessive Hunger, Hair Changes, Heat Intolerance, Hot flashes and New Diabetes. Hematology Present- Gland problems. Not Present- Blood Thinners, Easy Bruising, Excessive bleeding, HIV and Persistent Infections.  Vitals (April Staton CMA; 11/03/2016 10:25 AM) 11/03/2016 10:25 AM Weight: 217.38 lb Height: 72in Body Surface Area: 2.21 m Body Mass Index: 29.48 kg/m  Temp.: 98.84F(Oral)  Pulse: 71 (Regular)  P.OX: 98% (Room air) BP: 132/88 (Sitting, Left Arm, Standard)  BP 134/80   Pulse (!) 55   Temp 97.8 F (36.6 C) (Oral)   Resp 16   Ht '6\' 6"'$  (1.981 m)   Wt 95.3 kg (210 lb)   SpO2 100%   BMI 24.27 kg/m       Physical Exam Adin Hector MD; 11/03/2016 1:50 PM) General Mental Status-Alert. General Appearance-Not in acute distress, Not Sickly. Orientation-Oriented  X3. Hydration-Well hydrated. Voice-Normal.  Integumentary Global Assessment Upon inspection and palpation of skin surfaces of the - Axillae: non-tender, no inflammation or ulceration, no drainage. and Distribution of scalp and body hair is normal. General Characteristics Temperature - normal warmth is noted.  Head and Neck Head-normocephalic, atraumatic with no lesions or palpable masses. Face Global Assessment - atraumatic, no absence of expression. Neck Global Assessment - no abnormal movements, no bruit auscultated on the right, no bruit auscultated on the left, no decreased range of motion, non-tender. Trachea-midline. Thyroid Gland Characteristics - non-tender.  Eye Eyeball - Left-Extraocular movements intact, No Nystagmus. Eyeball - Right-Extraocular movements intact, No Nystagmus. Cornea - Left-No Hazy. Cornea - Right-No Hazy. Sclera/Conjunctiva - Left-No scleral icterus, No Discharge. Sclera/Conjunctiva - Right-No scleral icterus, No Discharge. Pupil - Left-Direct reaction to light normal. Pupil - Right-Direct reaction to light normal.  ENMT Ears Pinna - Left - no drainage observed, no generalized tenderness observed. Right - no drainage observed, no generalized tenderness observed. Nose and Sinuses External Inspection of the Nose - no destructive lesion observed. Inspection of the nares - Left - quiet respiration. Right - quiet respiration. Mouth and Throat Lips - Upper Lip - no fissures observed, no pallor noted. Lower Lip - no fissures observed, no pallor noted. Nasopharynx - no discharge present. Oral Cavity/Oropharynx - Tongue - no dryness observed. Oral Mucosa - no cyanosis observed. Hypopharynx - no evidence of airway distress observed.  Chest and Lung Exam Inspection Movements - Normal and Symmetrical. Accessory muscles - No use of accessory muscles in breathing. Palpation Palpation of the chest reveals -  Non-tender. Auscultation Breath sounds - Normal and Clear.  Cardiovascular Auscultation Rhythm - Regular. Murmurs & Other Heart Sounds - Auscultation of the heart reveals - No Murmurs and No Systolic Clicks.  Abdomen Inspection Inspection of the abdomen reveals -  No Visible peristalsis and No Abnormal pulsations. Umbilicus - No Bleeding, No Urine drainage. Palpation/Percussion Palpation and Percussion of the abdomen reveal - Soft, Non Tender, No Rebound tenderness, No Rigidity (guarding) and No Cutaneous hyperesthesia. Note: Abdomen soft. Nontender, nondistended. No guarding. No diastasis. No umbilical nor other hernias   Male Genitourinary Sexual Maturity Tanner 5 - Adult hair pattern and Adult penile size and shape. Note: Very sensitive along the right external ring & pubic along cord & nerves. Very sensitive on right testicle as well. Some symptoms and sensitivity superior medially to the anterior superior iliac spine on the right close to where the ilioinguinal nerve injections took place.   No inguinal hernias. Normal external genitalia. Epididymi, testes, and spermatic cords normal without any masses.   Peripheral Vascular Upper Extremity Inspection - Left - No Cyanotic nailbeds, Not Ischemic. Right - No Cyanotic nailbeds, Not Ischemic.  Neurologic Neurologic evaluation reveals -normal attention span and ability to concentrate, able to name objects and repeat phrases. Appropriate fund of knowledge , normal sensation and normal coordination. Mental Status Affect - not angry, not paranoid. Cranial Nerves-Normal Bilaterally. Gait-Normal.  Neuropsychiatric Mental status exam performed with findings of-able to articulate well with normal speech/language, rate, volume and coherence, thought content normal with ability to perform basic computations and apply abstract reasoning and no evidence of hallucinations, delusions, obsessions or homicidal/suicidal  ideation.  Musculoskeletal Global Assessment Spine, Ribs and Pelvis - no instability, subluxation or laxity. Right Upper Extremity - no instability, subluxation or laxity.  Lymphatic Head & Neck  General Head & Neck Lymphatics: Bilateral - Description - No Localized lymphadenopathy. Axillary  General Axillary Region: Bilateral - Description - No Localized lymphadenopathy. Femoral & Inguinal  Generalized Femoral & Inguinal Lymphatics: Left - Description - No Localized lymphadenopathy. Right - Description - No Localized lymphadenopathy.    Assessment & Plan   GROIN PAIN, CHRONIC, RIGHT (R10.31) Impression: Severe right groin pain that seems to focus right at the genitofemoral and ilioinguinal nerves. Most likely due to metal tacks with mesh repair.  There are no easy solutions here.  Discussed with my colleagues, Dr. Rosendo Gros and Dr. Brantley Stage. The patient has exhausted his options with trial of anti-inflammatories, muscle relaxants, neuropathic pain medications. He cannot tolerate the latter. Pelvic floor physical therapy. No improvement with local nerve injections at least 4 times. No evidence of hernia recurrence by CAT scan. No evidence of urological problems on that side  The only other option left is to explore laparoscopically vs open. Most likely a TAPP vs TEP approach. Locate and excise the mesh & untwist/remove all tacks on that side. We the left side alone as it is asymptomatic. Replace with a thin sheet of mesh without tacks. Triple neurectomy lap versus open assisted. Give a chance to help release a trapped or chronically irritated nerve  The biggest issue is that it may not solve his problems or make things worse. However, he has exhausted other options with interventions by numerous specialists. He would like to proceed.   PREOP - ING HERNIA - ENCOUNTER FOR PREOPERATIVE EXAMINATION FOR GENERAL SURGICAL PROCEDURE (Z01.818) Current Plans You are being scheduled for  surgery- Our schedulers will call you.  You should hear from our office's scheduling department within 5 working days about the location, date, and time of surgery. We try to make accommodations for patient's preferences in scheduling surgery, but sometimes the OR schedule or the surgeon's schedule prevents [place] from making those accommodations.  If you have not heard from  our office [phone]) in 5 working days, call the office and ask for your surgeon's nurse.  If you have other questions about your diagnosis, plan, or surgery, call the office and ask for your surgeon's nurse.  Written instructions provided The anatomy & physiology of the abdominal wall and pelvic floor was discussed. The pathophysiology of hernias in the inguinal and pelvic region was discussed. Natural history risks such as progressive enlargement, pain, incarceration, and strangulation was discussed. Contributors to complications such as smoking, obesity, diabetes, prior surgery, etc were discussed.  I feel the risks of no intervention will lead to serious problems that outweigh the operative risks; therefore, I recommended surgery to reduce and repair the hernia. I explained laparoscopic techniques with possible need for an open approach. I noted usual use of mesh to patch and/or buttress hernia repair  Risks such as bleeding, infection, abscess, need for further treatment, heart attack, death, and other risks were discussed. I noted a fair to good likelihood this will help address the problem. He understands this may not help and may even worsen. However he has exhausted other options. Goals of post-operative recovery were discussed as well. Possibility that this will not correct all symptoms was explained. I stressed the importance of low-impact activity, aggressive pain control, avoiding constipation, & not pushing through pain to minimize risk of post-operative chronic pain or injury. Possibility of  reherniation was discussed. We will work to minimize complications.  An educational handout further explaining the pathology & treatment options was given as well. Questions were answered. The patient expresses understanding & wishes to proceed with surgery.  Pt Education - Pamphlet Given - Laparoscopic Hernia Repair: discussed with patient and provided information. Pt Education - [facility] Pain Control (Kenyanna Grzesiak) Pt Education - [facility] Hernia Post-Op HCI (Ilana Prezioso): discussed with patient and provided information.  I have re-reviewed the the patient's records, history, medications, and allergies.  I have re-examined the patient.  I again discussed intraoperative plans and goals of post-operative recovery.  The patient agrees to proceed.  Ivin Rosenbloom [id number]  [birth year] [phone]  Patient Care Team: Mateo Flow, MD as PCP - General (Family Medicine) Michael Boston, MD as Consulting Physician (General Surgery) Irine Seal, MD as Attending Physician (Urology) Kendell Bane, MD as Referring Physician (Surgery)  There are no active problems to display for this patient.   Past Medical History:  Diagnosis Date  . Hypertension    hx of  . Multiple lipomas     Past Surgical History:  Procedure Laterality Date  . HERNIA REPAIR     bil groin    Social History   Social History  . Marital status: Married    Spouse name: N/A  . Number of children: N/A  . Years of education: N/A   Occupational History  . Not on file.   Social History Main Topics  . Smoking status: Former Research scientist (life sciences)  . Smokeless tobacco: Former Systems developer    Quit date: 12/17/2011     Comment: 2 years in high school  . Alcohol use No  . Drug use: No  . Sexual activity: Yes   Other Topics Concern  . Not on file   Social History Narrative  . No narrative on file    History reviewed. No pertinent family history.  Current Facility-Administered Medications  Medication Dose Route Frequency Provider Last Rate Last Dose  . ceFAZolin  (ANCEF) 2-4 GM/100ML-% IVPB           . ceFAZolin (ANCEF) IVPB 2g/100 mL  premix  2 g Intravenous On Call to OR Michael Boston, MD      . Chlorhexidine Gluconate Cloth 2 % PADS 6 each  6 each Topical Once Michael Boston, MD       And  . Chlorhexidine Gluconate Cloth 2 % PADS 6 each  6 each Topical Once Michael Boston, MD      . scopolamine (TRANSDERM-SCOP) 1 MG/3DAYS 1.5 mg  1 patch Transdermal Q72H Duane Boston, MD   1.5 mg at 12/18/16 0550   Facility-Administered Medications Ordered in Other Encounters  Medication Dose Route Frequency Provider Last Rate Last Dose  . lactated ringers infusion    Continuous PRN Dione Booze, CRNA         No Known Allergies  BP 134/80   Pulse (!) 55   Temp 97.8 F (36.6 C) (Oral)   Resp 16   Ht '6\' 6"'$  (1.981 m)   Wt 95.3 kg (210 lb)   SpO2 100%   BMI 24.27 kg/m   Labs: Results for orders placed or performed during the hospital encounter of 12/16/16 (from the past 48 hour(s))  CBC     Status: None   Collection Time: 12/16/16  2:00 PM  Result Value Ref Range   WBC 5.2 4.0 - 10.5 K/uL   RBC 5.18 4.22 - 5.81 MIL/uL   Hemoglobin 15.6 13.0 - 17.0 g/dL   HCT 44.9 39.0 - 52.0 %   MCV 86.7 78.0 - 100.0 fL   MCH 30.1 26.0 - 34.0 pg   MCHC 34.7 30.0 - 36.0 g/dL   RDW 12.3 11.5 - 15.5 %   Platelets 175 150 - 400 K/uL  Basic metabolic panel     Status: None   Collection Time: 12/16/16  2:00 PM  Result Value Ref Range   Sodium 141 135 - 145 mmol/L   Potassium 4.7 3.5 - 5.1 mmol/L   Chloride 104 101 - 111 mmol/L   CO2 29 22 - 32 mmol/L   Glucose, Bld 99 65 - 99 mg/dL   BUN 13 6 - 20 mg/dL   Creatinine, Ser 1.08 0.61 - 1.24 mg/dL   Calcium 9.4 8.9 - 10.3 mg/dL   GFR calc non Af Amer >60 >60 mL/min   GFR calc Af Amer >60 >60 mL/min    Comment: (NOTE) The eGFR has been calculated using the CKD EPI equation. This calculation has not been validated in all clinical situations. eGFR's persistently <60 mL/min signify possible Chronic Kidney Disease.     Anion gap 8 5 - 15    Imaging / Studies: No results found.   Adin Hector, M.D., F.A.C.S. Gastrointestinal and Minimally Invasive Surgery [facility] [address] [place], [place] [address] [phone] Main / Paging  12/18/2016 7:25 AM

## 2016-12-18 NOTE — Transfer of Care (Signed)
Immediate Anesthesia Transfer of Care Note  Patient: Sergio Moore  Procedure(s) Performed: Procedure(s): REDO RIGHT INGUINAL HERNIA, PERIPHERAL NEURECTOMY (Bilateral) INSERTION OF MESH (Bilateral) LAPAROSCOPIC LYSIS OF ADHESIONS WITH MESH AND TACK REMOVAL (N/A)  Patient Location: PACU  Anesthesia Type:General  Level of Consciousness: awake, alert , oriented and patient cooperative  Airway & Oxygen Therapy: Patient Spontanous Breathing and Patient connected to face mask oxygen  Post-op Assessment: Report given to RN and Post -op Vital signs reviewed and stable  Post vital signs: Reviewed and stable  Last Vitals:  Vitals:   12/18/16 0525  BP: 134/80  Pulse: (!) 55  Resp: 16  Temp: 36.6 C    Last Pain:  Vitals:   12/18/16 0525  TempSrc: Oral      Patients Stated Pain Goal: 4 (15/86/82 5749)  Complications: No apparent anesthesia complications

## 2016-12-18 NOTE — Op Note (Signed)
12/18/2016  11:04 AM  PATIENT:  Sergio Moore  41 y.o. male  Patient Care Team: Sergio Flow, MD as PCP - General (Family Medicine) Sergio Boston, MD as Consulting Physician (General Surgery) Sergio Seal, MD as Attending Physician (Urology) Sergio Bane, MD as Referring Physician (Surgery)  PRE-OPERATIVE DIAGNOSIS:  chronic right groin pain.  POST-OPERATIVE DIAGNOSIS:  chronic right groin pain.  PROCEDURE:  LAPAROSCOPIC LYSIS OF ADHESIONS x 2 hours REMOVAL OF RIGHT PREPERITONEAL MESH & tacks  TRIPLE PERIPHERAL NEURECTOMY REDO RIGHT INGUINAL HERNIA REPAIR with INSERTION OF MESH   SURGEON:  Sergio Hector, MD  ASSISTANT: RN   ANESTHESIA:   regional and general Ilioinguinal, genitofemoral, and spermatic cord nerve blocks  EBL:  Total I/O In: 1000 [I.V.:1000] Out: 100 [Blood:100]  Delay start of Pharmacological VTE agent (>24hrs) due to surgical blood loss or risk of bleeding:  NO  DRAINS: none   SPECIMEN:  No Specimen  DISPOSITION OF SPECIMEN:  N/A  COUNTS:  YES  PLAN OF CARE: Discharge to home after PACU  PATIENT DISPOSITION:  PACU - hemodynamically stable.  INDICATION: Pleasant active gentleman status post bilateral inguinal hernia appears an outside institution.  [facility].  Patient has had chronic right groin pain since the surgery.  Left side fine.  It is been refractory to numerous interventions including anti-inflammatory neuropathic pain.  Steroid and focal injections.  Urology and general surgical evaluations.  Physical therapy and stretching.  Patient Sergio Moore second opinion.  I offered lap's, possible open exploration.  Plan would be to remove right-sided mesh and any tacks.  Peritoneal repair.  Different sheet of mesh without tacks.  Triple neurectomy of genitofemoral, ilioinguinal, iliohypogastric nerves.  Discussed at length the risks that this may not resolve his pain, could make it worse, hernia recurrence, injury to other organs, testicular  loss, etc.  Patient wishes to proceed with surgery since it is severely debilitating.  The anatomy & physiology of the abdominal wall and pelvic floor was discussed.  The pathophysiology of hernias in the inguinal and pelvic region was discussed.  Natural history risks such as progressive enlargement, pain, incarceration & strangulation was discussed.   Contributors to complications such as smoking, obesity, diabetes, prior surgery, etc were discussed.    I feel the risks of no intervention will lead to serious problems that outweigh the operative risks; therefore, I recommended surgery to reduce and repair the hernia.  I explained laparoscopic techniques with possible need for an open approach.  I noted usual use of mesh to patch and/or buttress hernia repair  Risks such as bleeding, infection, abscess, need for further treatment, heart attack, death, and other risks were discussed.  I noted a good likelihood this will help address the problem.   Goals of post-operative recovery were discussed as well.  Possibility that this will not correct all symptoms was explained.  I stressed the importance of low-impact activity, aggressive pain control, avoiding constipation, & not pushing through pain to minimize risk of post-operative chronic pain or injury. Possibility of reherniation was discussed.  We will work to minimize complications.     An educational handout further explaining the pathology & treatment options was given as well.  Questions were answered.  The patient expresses understanding & wishes to proceed with surgery.  OR FINDINGS: Patient had well incorporated Bard 3-D type mesh.  Contracted posteriorly: Inferior & lateral corner folded back upon itself anteriorly.  Genitofemoral nerve very densely adherent to the inferoposterior corner of the mesh, probable  focus of irritation..  Three clips on the tissues near and perhaps one on the right vas deferens.  The vas deferens very contracted and  atrophic with extremely dense adhesions to the mesh centrally.  Not able to be salvaged on that side.  Seven spiral ProTack type metal tacks noted on the mesh.  Primarily in the suprapubic and retrorectus paramedian region.  Most involved with the right pubic tubercle.  No evidence of hernia recurrence on the left side.  Well incorporated mesh and left alone on the left.  DESCRIPTION:   The patient was identified & brought into the operating room. The patient was positioned supine with arms tucked. SCDs were active during the entire case. The patient underwent general anesthesia without any difficulty.  The abdomen was prepped and draped in a sterile fashion. A Surgical Timeout confirmed our plan.  I made a transverse incision through the inferior umbilical fold.  I made a small transverse nick through the anterior rectus fascia contralateral to the inguinal hernia side and placed a 0-vicryl stitch through the fascia.  I placed a Hasson trocar into the preperitoneal plane.  Entry was clean.  We induced carbon dioxide insufflation. Camera inspection revealed no injury. I used a 34mm angled scope to bluntly free the peritoneum off the infraumbilical anterior abdominal wall.  I created enough of a preperitoneal pocket to place 22mm ports into the right & left mid-abdomen into this preperitoneal cavity.   I focused on freeing the peritoneum off the anterior abdominal wall.'s able to get to a more clean plane laterally along the right flank superior to the iliac crest.  Follow more inferiorly until I counted the superior rim of the Bard 3-D mesh.  The mesh did have excellent incorporation.  Peritoneum was rather thinned out at the level of the internal ring.  I was able to free the peritoneum off the mesh laterally and then come around medially as well.    I freed most of the perotineum off the mesh.  The peritoneum was very thin and densely adherent around the mesh covering the indirect hernia near the  internal ring, so that could not be spared.  I was able to come medially and freed the bladder off the mesh and have that out of the way.  I freed the mesh off the suprapubic, pelvic, femoral, iliac, inguinal region.  I used blunt & focused sharp dissection to free the peritoneum off the flank and down to the pubic rim.  I primarily came around laterally.  Freed the mesh off the retroperitoneum, flank, and anterior abdominal wall.  There was a nerve densely adherent to the mesh on the inferior medial corner .  That seen becoming more from the lateral flank.  Most suspicious for the ilioinguinal nerve.  I carefully freed that nerve off of the contracted mesh.  With further dissection in the lateral half of the mesh off I could see that the posterior lateral edge had flipped back upon itself with a prominent fold.    I freed the medial part of the mesh off the posterior rectus muscle tie came down to the pubis.  Encounter some metal tacks on the posterior right rectus muscle.  I removed a few of them that were densely adherent.  Fall that to the pubic rim.  Returned to mobilize the mesh off the spermatic vessels.  The vas deferens was densely adherent to the mesh.  I could see 35 mm like clips near the vas deferens.  One may be partially across the vas but most seen to be with the tissue next to it, implying the artery along with it.  I worked to try and spare the vas deferens and freeing it off the mesh but it was atrophic scarred and densely adherent, so cannot be spared.  I was able to free the mesh off the iliac vessels and direct space.  This just left the pubic rim.  This was the area where it was most duct.  Gradually freed off the mesh and several tacks with it off the pubic brim.  Freed the mesh off and leaving one little cuff of mesh.  Went back to that cuff and confirmed that there was attack associated with it.  Freddrick March that tack off so I could therefore get that last inferomedial cuff mesh off the  anterior ramus of the pelvis.  With that tacking corner released, I was able to come around the medial inferior corner and free off the medial corner off the left sided mesh.  I do not see any obvious hernia recurrence on the left side he had no complaints left side, so I left the left part mesh alone.  I removed the mesh out the umbilical port  along with the small mesh cuff.  I confirmed that I had intact removal.  I confirmed #4 more tacks within the mesh itself in the suprapubic region.  I counted three more tacks that I had removed separately during dissection.    I did inspection.  I saw no remnant of mesh nor any tacks in the right pubic, inguinal, flank, anterior abdominal wall region.  Did further dissection to identify the iliohypogastric and ilioinguinal nerves coming towards the iliac crest along the iliacus muscle as well as the genitofemoral nerve coming off the medial part of the psoas muscle in the retroperitoneum.  I skeletonize and dissect them as they are coming off the posterior flank before heading to the internal ring/inguinal region.  I freed peritoneum off the spermatic vessels as well.  I came around medially.  Again confirmed the split vas deferens with central atrophy and scarring but healthy at the at the internal ring and retroperitoneum.  I did remove the clips off the vas deferens x 3.  He had a fair amount of oozing during the case but after washed out and inspection, hemostasis was good.  I returned inspection to the nerves to confirm correct anatomy.  I excised the nerves with scissors.  I sent each individual nerve separately.  Iliohypogastric, ilioinguinal, and genitofemoral.  Two of good quality.  The genitofemoral seemed rather poor quality.  That was the most densely adherent.    As anticipated, surgical dissection had been challenged by dense adhesions and poor planes resulting in the ability to spare the right vas deferens.  Also there were peritoneal defects.  Repair  peritoneal defects was done with 2-0 Vicryl minimally invasive intracorporeal suturing using absorbable suture.  I could a hernia defect at the internal ring.  was consistent with the indirect hernia that had been diagnosed on initial surgery.  There is no evidence of any direct spaced inguinal, femoral, nor bturator hernias.  I chose a 15x15 cm sheet of mesh (ultra-lightweight polypropylene = Ultrapro) to patch the hernia.  I cut a sigmoid-shaped slit within a side of the mesh, about 6cm from one edge.  I placed the mesh into the preperitoneal space & laid it as a diamond such that the inferior point had a  6x6 cm corner flap resting in the true pelvis, covering the obturator & femoral foramina.     I allowed the bladder to return to the pubis, this helping tuck the corners of the mesh in the anteriolateral pelvis.  The medial corners overlapped each other across midline cephalad to the pubic rim.   This provided >2 inch coverage around the hernia.  Because the defects well covered and not particularly large and in order to avoid future nerve pain, I did not place any tacks.  I held the hernia sac cephalad & evacuated carbon dioxide.  I closed the infraumbilical wound with 0-vicryl suture at the fascia and 4-0 monocryl stitch at the skin.  Sterile dressings were applied. The patient was extubated & arrived in the PACU in stable condition..  I had discussed postoperative care with the patient in the holding area. I discussed operative findings, updated the patient's status, discussed probable steps to recovery, and gave postoperative recommendations to the patient's family.  Recommendations were made.  Note that the right vas deferens cannot be spared.  Patient's wife said they had on all the children they needed & she was not concerned.  Questions were answered.  They expressed understanding & appreciation.  Sergio Moore, M.D., F.A.C.S. Gastrointestinal and Minimally Invasive Surgery [facility] [address], [place], [place] [address] [phone] Main / Paging

## 2016-12-18 NOTE — Progress Notes (Signed)
Asst up to void w 2 staff members. Patient unsteady on his feet.Voiding unsuccessful

## 2016-12-19 ENCOUNTER — Encounter (HOSPITAL_COMMUNITY): Payer: Self-pay | Admitting: Surgery

## 2017-01-31 NOTE — Addendum Note (Signed)
Addendum  created 01/31/17 0830 by Duane Boston, MD   Sign clinical note

## 2018-08-30 DIAGNOSIS — M4712 Other spondylosis with myelopathy, cervical region: Secondary | ICD-10-CM | POA: Insufficient documentation

## 2018-08-30 HISTORY — PX: OTHER SURGICAL HISTORY: SHX169

## 2018-12-06 IMAGING — MR MR PELVIS W/O CM
14 of 27 series · 21 of 48 positions shown · IV contrast (multihance)
Comparison: CT 10/20/2016

CLINICAL DATA: RIGHT renal lesion. RIGHT inguinal pain radiating to
the testicle.

EXAM:
MRI ABDOMEN AND PELVIS WITHOUT AND WITH CONTRAST
TECHNIQUE: Multiplanar multisequence MR imaging of the abdomen and pelvis was
performed both before and after the administration of intravenous
contrast.
CONTRAST:  19mL MULTIHANCE GADOBENATE DIMEGLUMINE 529 MG/ML IV SOLN

[Series 4: T2 · coronal · 6.0mm · 0.78mm/px · 2 of 34 slices shown (1 of 5)]
[im 1/34]
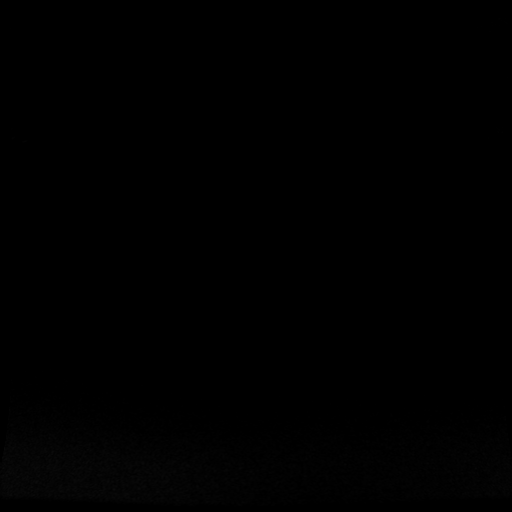
[im 34/34]
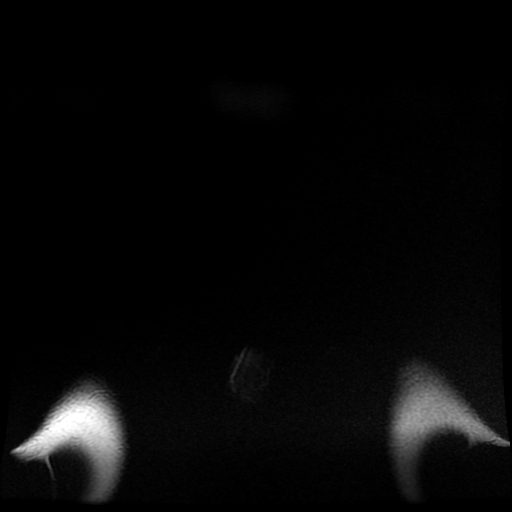

[Series 5: T2 · axial · 5.0mm · 0.53mm/px · z∈[-283,+5]mm · 2 of 49 slices shown (2 of 5)]
[im 1/49]
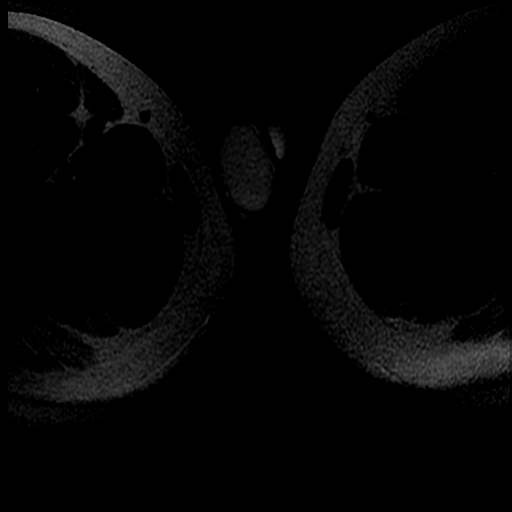
[im 49/49]
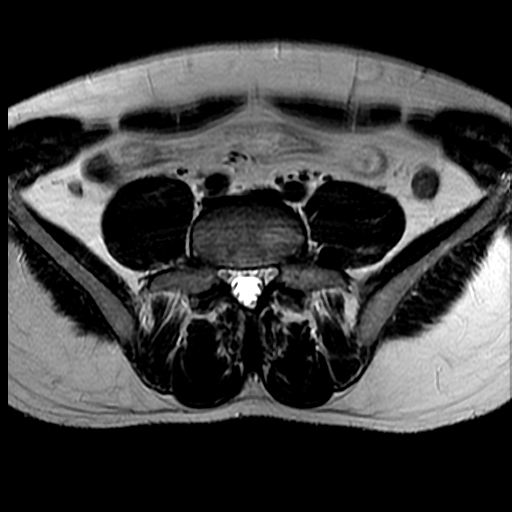

[Series 7: T2 fat-sat · axial · 5.0mm · 0.53mm/px · z∈[-287,+1]mm · 2 of 49 slices shown (1 of 2)]
[im 1/49]
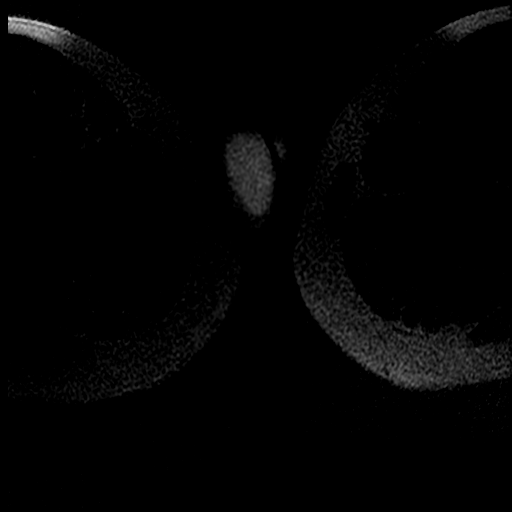
[im 49/49]
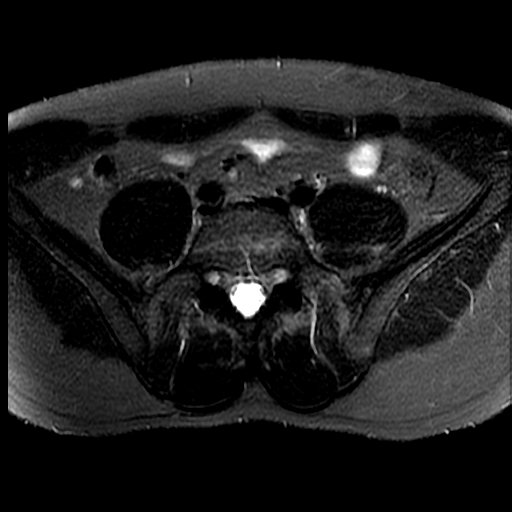

[Series 8: T2 · sagittal · 5.0mm · 0.55mm/px · 2 of 37 slices shown (3 of 5)]
[im 1/37]
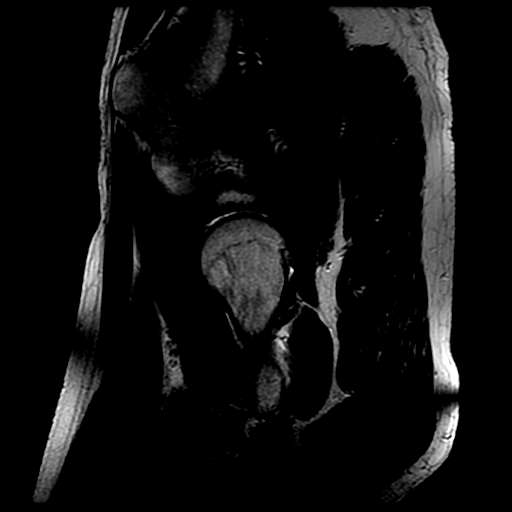
[im 37/37]
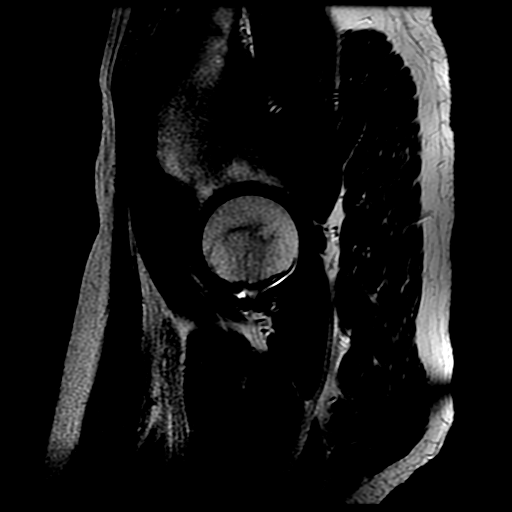

[Series 9: T1 · axial · 5.0mm · 0.53mm/px · z∈[-287,+1]mm · 2 of 49 slices shown]
[im 1/49]
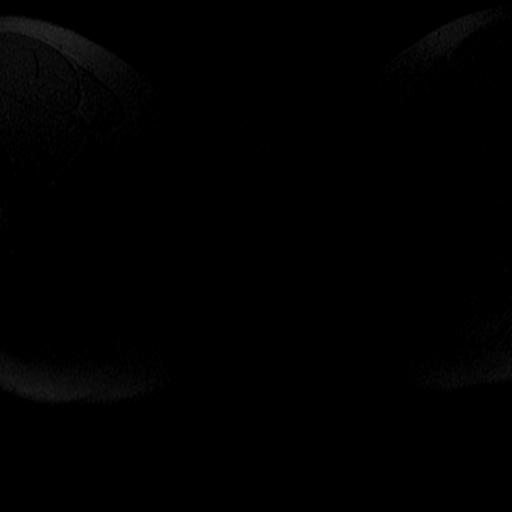
[im 49/49]
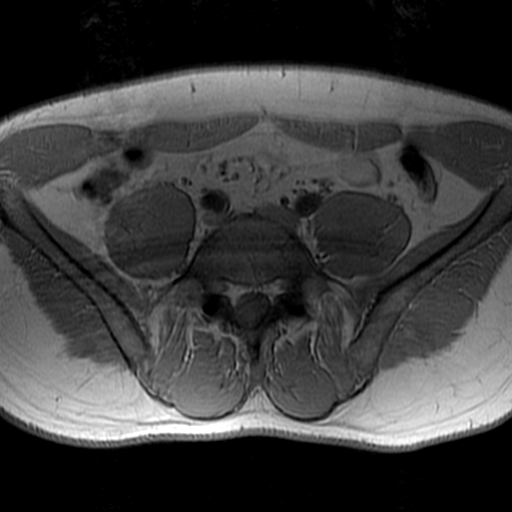

[Series 10: T1 fat-sat · axial · 5.0mm · 0.53mm/px · 1 of 49 slices shown]
[im 1/49]
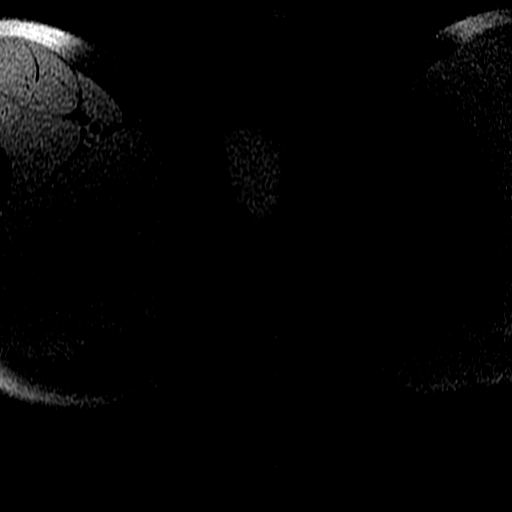

[Series 14: DWI b500 · axial · 6.0mm · 1.48mm/px · z∈[+128,+401]mm · 2 of 72 slices shown]
[im 1/72]
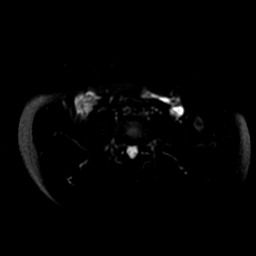
[im 72/72]
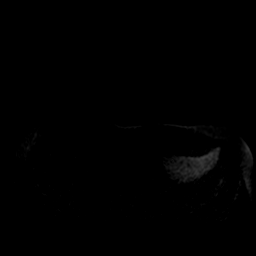

[Series 15: T2 fat-sat · axial · 5.0mm · 0.78mm/px · 1 of 48 slices shown (2 of 2)]
[im 1/48]
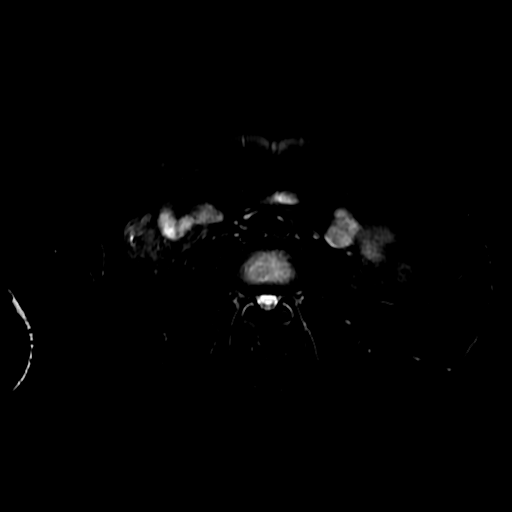

[Series 16: ax dualecho · axial · 5.0mm · 0.78mm/px · z∈[+160,+395]mm · 2 of 96 slices shown]
[im 1/96]
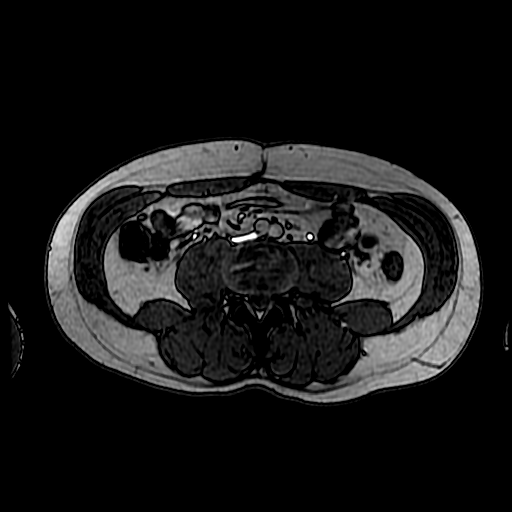
[im 96/96]
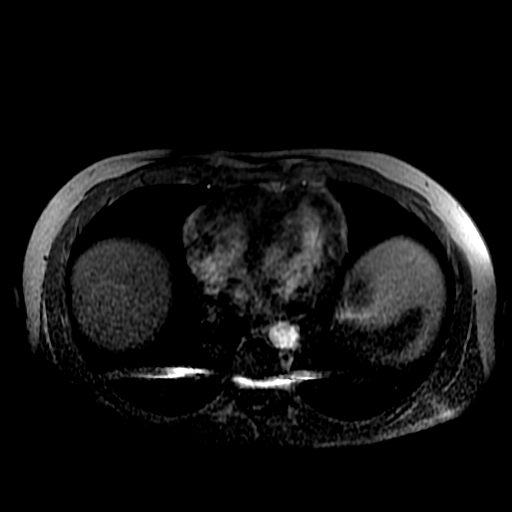

[Series 17: T2 · axial · 5.0mm · 0.78mm/px · 1 of 48 slices shown (4 of 5)]
[im 1/48]
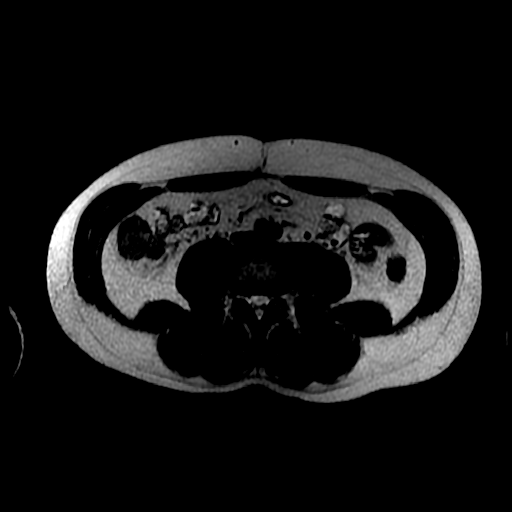

[Series 18: T2 · coronal · 5.0mm · 0.78mm/px · 1 of 40 slices shown (5 of 5)]
[im 1/40]
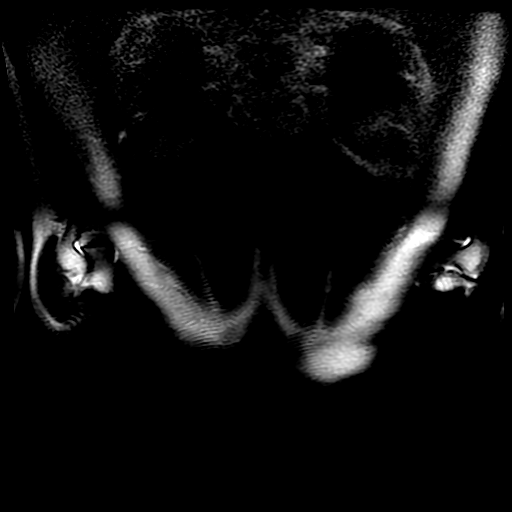

[Series 19: bSSFP · axial · 5.0mm · 0.78mm/px · 1 of 48 slices shown]
[im 1/48]
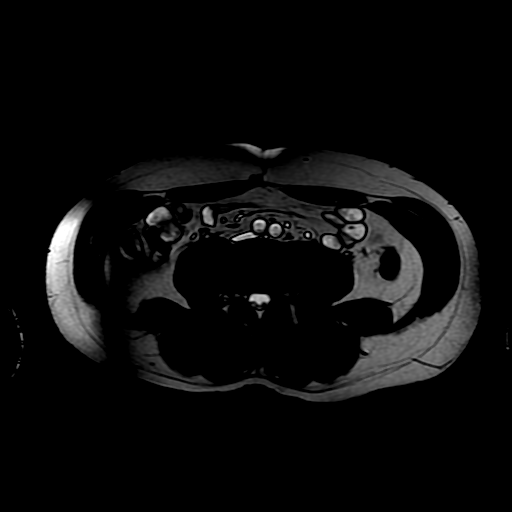

[Series 1400: DWI · axial · 6.0mm · 1.48mm/px · 1 of 36 slices shown]
[im 1/36]
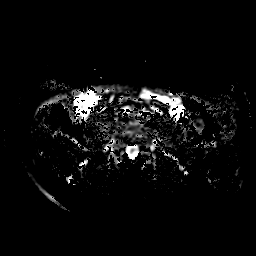

[Series 2000: T1 dynamic · axial · 5.0mm · 0.78mm/px · 1 of 88 slices shown]
[im 1/88]
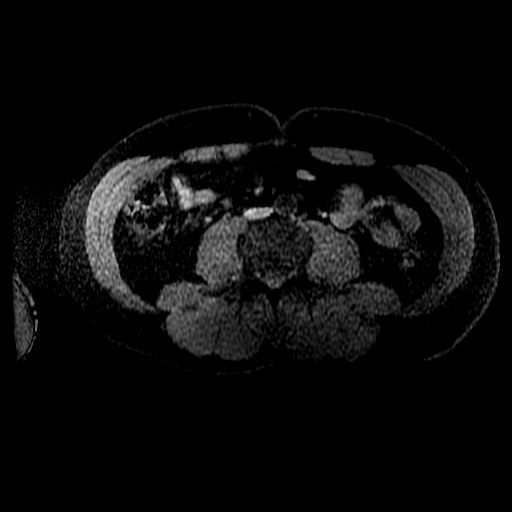

[21 of 48 positions shown; findings below may reference images not displayed]

FINDINGS: COMBINED FINDINGS FOR BOTH MR ABDOMEN AND PELVIS

Lower chest: Lung bases are clear.

Hepatobiliary: No focal hepatic lesion. No biliary duct dilatation.
Gallbladder is normal. Common bile duct is normal.

Pancreas: Pancreas is normal. No ductal dilatation. No pancreatic
inflammation.

Spleen: Normal spleen

Adrenals/urinary tract: Adrenal glands are normal.

Within the upper pole of the LEFT kidney round lesion measuring 19 x
14 mm (image 13, series 15) has high signal intensity on T2 weighted
imaging and corresponds to the indeterminate lesion on comparison
CT. Lesion is predominantly hypointense on T1 weighted imaging.
There is a rim of high signal intensity dependently within the
lesion on T1 weighted series 5000 consistent with small amount
hemorrhage or protein. Postcontrast imaging demonstrates no
enhancing components within this cystic lesion.

Stomach/Bowel: Stomach, and bowel are unremarkable

Vascular/Lymphatic: Abdominal aorta is normal caliber. There is no
retroperitoneal or periportal lymphadenopathy. No pelvic
lymphadenopathy.

Reproductive: Prostate gland is normal

Other: No inguinal hernia. Prior RIGHT inguinal hernia repair
without complication. The testicles are normal on LEFT and RIGHT. No
hydrocele.

Musculoskeletal: No osseous abnormality in the pelvis
IMPRESSION: 1. Bosniak II renal cyst of the LEFT kidney.
2. No explanation for RIGHT lower quadrant pain / inguinal pain. No
complication following hernia repair. Testicles are grossly normal.

## 2019-02-15 ENCOUNTER — Encounter: Payer: Self-pay | Admitting: Gastroenterology

## 2019-02-17 ENCOUNTER — Telehealth (INDEPENDENT_AMBULATORY_CARE_PROVIDER_SITE_OTHER): Payer: BC Managed Care – PPO | Admitting: Gastroenterology

## 2019-02-17 ENCOUNTER — Encounter: Payer: Self-pay | Admitting: Gastroenterology

## 2019-02-17 ENCOUNTER — Other Ambulatory Visit: Payer: Self-pay

## 2019-02-17 VITALS — Ht 74.0 in | Wt 205.0 lb

## 2019-02-17 DIAGNOSIS — R194 Change in bowel habit: Secondary | ICD-10-CM

## 2019-02-17 DIAGNOSIS — Z8719 Personal history of other diseases of the digestive system: Secondary | ICD-10-CM

## 2019-02-17 DIAGNOSIS — R1031 Right lower quadrant pain: Secondary | ICD-10-CM | POA: Diagnosis not present

## 2019-02-17 DIAGNOSIS — G8929 Other chronic pain: Secondary | ICD-10-CM

## 2019-02-17 NOTE — Patient Instructions (Signed)
If you are age 41 or older, your body mass index should be between 23-30. Your Body mass index is 26.32 kg/m. If this is out of the aforementioned range listed, please consider follow up with your Primary Care Provider.  If you are age 71 or younger, your body mass index should be between 19-25. Your Body mass index is 26.32 kg/m. If this is out of the aformentioned range listed, please consider follow up with your Primary Care Provider.   You have been scheduled for a colonoscopy. Please follow written instructions given to you at your visit today.  Please pick up your prep supplies at the pharmacy within the next 1-3 days. If you use inhalers (even only as needed), please bring them with you on the day of your procedure. Your physician has requested that you go to www.startemmi.com and enter the access code given to you at your visit today. This web site gives a general overview about your procedure. However, you should still follow specific instructions given to you by our office regarding your preparation for the procedure.  To help prevent the possible spread of infection to our patients, communities, and staff; we will be implementing the following measures:  As of now we are not allowing any visitors/family members to accompany you to any upcoming appointments with [facility]. If you have any concerns about this please contact our office to discuss prior to the appointment.   Thank you,  Dr. Jackquline Denmark

## 2019-02-17 NOTE — Progress Notes (Signed)
Chief Complaint: Change in stool caliber  Referring Provider:  Mateo Flow, MD      ASSESSMENT AND PLAN;   #1.  Change in bowel habits/change in stool caliber with intestinal gas. #2.  H/O rectal bleeding (resolved) #3.  Chronic right lower quadrant abdominal pain, s/p lap LOA with replacement of old mesh for inguinal hernia repair (Dr Johney Maine)  Plan: - Proceed with colonoscopy with miralax prep. Discussed risks & benefits. (Risks including rare perforation req laparotomy, bleeding after biopsies/polypectomy req blood transfusion, rare chance of missing neoplasms, risks of anesthesia/sedation). Benefits outweigh the risks. Patient agrees to proceed. All the questions were answered.  -He will get routine blood work at Dr. Marella Bile office (part of annual routine physical)   HPI:    Sergio Moore is a 43 y.o. male  With change in stool caliber-stool becoming more flat and pencillike.  One episode of rectal bleeding with blood mixed with the stool.  Has chronic right lower quadrant abdominal pain started after bilateral inguinal hernia repair.  Dr. Johney Maine had to replace the mesh on the right side after LOA.  He continues to have abdominal pain.  Has lost 10 pounds in the last 4 months.  He is an avid runner.  Mom is a patient of ours.  No upper GI symptoms including nausea, heartburn, regurgitation odynophagia or dysphagia.  Has some abdominal bloating and does pass gas which is very foul-smelling.  Denies having any significant diarrhea or constipation but would occasionally alternate between the two.  No fever chills night sweats.  No skin rash.  No family history of Crohn's disease.  [place] in [place], mother is a patient of ours. Past Medical History:  Diagnosis Date  . GERD (gastroesophageal reflux disease)   . Hypertension    hx of  . Multiple lipomas     Past Surgical History:  Procedure Laterality Date  . cervical interior infusion   08/30/2018  . HERNIA REPAIR     bil groin  . INGUINAL HERNIA REPAIR Bilateral 12/18/2016   Procedure: REDO RIGHT INGUINAL HERNIA, PERIPHERAL NEURECTOMY;  Surgeon: Michael Boston, MD;  Location: [facility];  Service: General;  Laterality: Bilateral;  . INSERTION OF MESH Bilateral 12/18/2016   Procedure: INSERTION OF MESH;  Surgeon: Michael Boston, MD;  Location: [facility];  Service: General;  Laterality: Bilateral;  . LAPAROSCOPIC INGUINAL HERNIA REPAIR Right 12/18/2016   Removal of old mesh and placement of new preperitoneal mesh  . LAPAROSCOPIC LYSIS OF ADHESIONS N/A 12/18/2016   Procedure: LAPAROSCOPIC LYSIS OF ADHESIONS WITH MESH AND TACK REMOVAL;  Surgeon: Michael Boston, MD;  Location: [facility];  Service: General;  Laterality: N/A;  . triple neurectomy - laparoscopic Right 12/18/2016    Family History  Problem Relation Age of Onset  . Colon cancer Neg Hx     Social History   Tobacco Use  . Smoking status: Former Research scientist (life sciences)  . Smokeless tobacco: Former Systems developer    Quit date: 12/17/2011  . Tobacco comment: 2 years in high school  Substance Use Topics  . Alcohol use: No  . Drug use: No    Current Outpatient Medications  Medication Sig Dispense Refill  . ALPRAZolam (XANAX) 1 MG tablet Take 1 mg by mouth 2 (two) times daily as needed for anxiety.    . gabapentin (NEURONTIN) 300 MG capsule 2 (two) times a day.    . lamoTRIgine (LAMICTAL) 100 MG tablet Take 100 mg by mouth 2 (two) times daily.    Marland Kitchen  traMADol (ULTRAM) 50 MG tablet Take by mouth.     No current facility-administered medications for this visit.     No Known Allergies  Review of Systems:  Constitutional: Denies fever, chills, diaphoresis, appetite change and fatigue.  HEENT: Denies photophobia, eye pain, redness, hearing loss, ear pain, congestion, sore throat, rhinorrhea, sneezing, mouth sores, neck pain, neck stiffness and tinnitus.   Respiratory: Denies SOB, DOE, cough, chest tightness,  and wheezing.   Cardiovascular: Denies chest  pain, palpitations and leg swelling.  Genitourinary: Denies dysuria, urgency, frequency, hematuria, flank pain and difficulty urinating.  Musculoskeletal: Denies myalgias, back pain, joint swelling, arthralgias and gait problem.  Skin: No rash.  Neurological: Denies dizziness, seizures, syncope, weakness, light-headedness, numbness and headaches.  Hematological: Denies adenopathy. Easy bruising, personal or family bleeding history  Psychiatric/Behavioral: has anxiety or depression     Physical Exam:    Ht 6\' 2"  (1.88 m)   Wt 205 lb (93 kg)   BMI 26.32 kg/m  Filed Weights   02/17/19 1344  Weight: 205 lb (93 kg)   Constitutional:  Well-developed, in no acute distress. Psychiatric: Normal mood and affect. Behavior is normal. televist  Data Reviewed: I have personally reviewed following labs and imaging studies  CBC: CBC Latest Ref Rng & Units 12/16/2016  WBC 4.0 - 10.5 K/uL 5.2  Hemoglobin 13.0 - 17.0 g/dL 15.6  Hematocrit 39.0 - 52.0 % 44.9  Platelets 150 - 400 K/uL 175    CMP: CMP Latest Ref Rng & Units 12/16/2016  Glucose 65 - 99 mg/dL 99  BUN 6 - 20 mg/dL 13  Creatinine 0.61 - 1.24 mg/dL 1.08  Sodium 135 - 145 mmol/L 141  Potassium 3.5 - 5.1 mmol/L 4.7  Chloride 101 - 111 mmol/L 104  CO2 22 - 32 mmol/L 29  Calcium 8.9 - 10.3 mg/dL 9.4   This service was provided via telemedicine.  The patient was located at office.  The provider was located in office.  The patient did consent to this telephone visit and is aware of possible charges through their insurance for this visit.  The patient was referred by Dr. Chancy Milroy.    Time spent on call/coordination of care: 20 min    Carmell Austria, MD 02/17/2019, 3:12 PM  Cc: Mateo Flow, MD

## 2019-02-25 ENCOUNTER — Encounter: Payer: Self-pay | Admitting: Gastroenterology

## 2019-03-07 ENCOUNTER — Telehealth: Payer: Self-pay | Admitting: Gastroenterology

## 2019-03-07 NOTE — Telephone Encounter (Signed)

## 2019-03-08 ENCOUNTER — Ambulatory Visit (AMBULATORY_SURGERY_CENTER): Payer: BC Managed Care – PPO | Admitting: Gastroenterology

## 2019-03-08 ENCOUNTER — Other Ambulatory Visit: Payer: Self-pay

## 2019-03-08 ENCOUNTER — Encounter: Payer: Self-pay | Admitting: Gastroenterology

## 2019-03-08 VITALS — BP 131/84 | HR 72 | Temp 98.9°F | Resp 20 | Ht 74.0 in | Wt 205.0 lb

## 2019-03-08 DIAGNOSIS — K649 Unspecified hemorrhoids: Secondary | ICD-10-CM | POA: Diagnosis not present

## 2019-03-08 DIAGNOSIS — K5730 Diverticulosis of large intestine without perforation or abscess without bleeding: Secondary | ICD-10-CM | POA: Diagnosis not present

## 2019-03-08 DIAGNOSIS — R194 Change in bowel habit: Secondary | ICD-10-CM | POA: Diagnosis present

## 2019-03-08 MED ORDER — SODIUM CHLORIDE 0.9 % IV SOLN: 500.0000 mL | Freq: Once | INTRAVENOUS | Status: DC

## 2019-03-08 MED ORDER — HYDROCORTISONE (PERIANAL) 2.5 % EX CREA
1.0000 | TOPICAL_CREAM | Freq: Two times a day (BID) | CUTANEOUS | 0 refills | Status: AC
Start: 2019-03-08 — End: 2019-03-18

## 2019-03-08 NOTE — Op Note (Signed)
[place] Patient Name: Sergio Moore Procedure Date: 03/08/2019 2:15 PM MRN: [id number] Endoscopist: Jackquline Denmark , MD Age: 43 Referring MD:  Date of Birth: 27-Apr-1976 Gender: Male Account #: [id number] Procedure:                Colonoscopy Indications:              1. Change in bowel habits/change in stool caliber                            with intestinal gas.                           #2. H/O rectal bleeding (resolved)                           #3. Chronic right lower quadrant abdominal pain,                            s/p lap LOA with replacement of old mesh for                            inguinal hernia repair (Dr Johney Maine) Medicines:                Monitored Anesthesia Care Procedure:                Pre-Anesthesia Assessment:                           - Prior to the procedure, a History and Physical                            was performed, and patient medications and                            allergies were reviewed. The patient's tolerance of                            previous anesthesia was also reviewed. The risks                            and benefits of the procedure and the sedation                            options and risks were discussed with the patient.                            All questions were answered, and informed consent                            was obtained. Prior Anticoagulants: The patient has                            taken no previous anticoagulant or antiplatelet  agents. ASA Grade Assessment: II - A patient with                            mild systemic disease. After reviewing the risks                            and benefits, the patient was deemed in                            satisfactory condition to undergo the procedure.                           After obtaining informed consent, the colonoscope                            was passed under direct vision. Throughout the   procedure, the patient's blood pressure, pulse, and                            oxygen saturations were monitored continuously. The                            Colonoscope was introduced through the anus and                            advanced to the 4 cm into the ileum. The                            colonoscopy was performed without difficulty. The                            patient tolerated the procedure well. The quality                            of the bowel preparation was adequate to identify                            polyps. There was some retained stool and solid                            vegetable material in some areas making it                            difficult to visualize. Aggressive suctioning and                            aspiration was performed. The suction channel of                            the scope got clogged few times due to solid                            vegetable material. Overall the examination was  adequate. 90-95% of the colonic gas was visualized                            satisfactorily. The colon was highly redundant. The                            terminal ileum, ileocecal valve, appendiceal                            orifice, and rectum were photographed. Scope In: 2:23:04 PM Scope Out: 2:44:53 PM Scope Withdrawal Time: 0 hours 16 minutes 50 seconds  Total Procedure Duration: 0 hours 21 minutes 49 seconds  Findings:                 A few rare small-mouthed diverticula were found in                            the sigmoid colon.                           The colon (entire examined portion) appeared                            normal. Biopsies were taken with a cold forceps for                            histology. Estimated blood loss: none.                           The terminal ileum appeared normal. There was some                            nodular hyperplasia (expected). Biopsies were taken                             with a cold forceps for histology. Estimated blood                            loss: none.                           Non-bleeding internal hemorrhoids were found during                            retroflexion. The hemorrhoids were small.                           The exam was otherwise without abnormality on                            direct and retroflexion views. Complications:            No immediate complications. Estimated Blood Loss:     Estimated blood loss: none. Impression:               -Minimal sigmoid diverticulosis.                           -  Small internal hemorrhoids.(Likely etiology of                            rectal bleeding-no bleeding currently)                           -Otherwise normal colonoscopy to TI. The colon was                            highly redundant. Recommendation:           - Patient has a contact number available for                            emergencies. The signs and symptoms of potential                            delayed complications were discussed with the                            patient. Return to normal activities tomorrow.                            Written discharge instructions were provided to the                            patient.                           - Resume previous diet.                           - Continue present medications.                           - Use HC Cream 2.5%: Apply externally BID prn for                            10 days.                           - Miralax 1 capful (17 grams) in 8 ounces of water                            PO daily.                           - Await pathology results.                           - Return to GI office in 12 weeks. If still with                            problems, will perform further work-up. Jackquline Denmark, MD 03/08/2019 2:57:21 PM This report has been signed electronically.

## 2019-03-08 NOTE — Progress Notes (Signed)
Report to PACU, RN, vss, BBS= Clear.  

## 2019-03-08 NOTE — Patient Instructions (Signed)
Handouts given for hemorrhoids, diverticulosis.  Use new prescription of Hydrocortisone cream twice a day for 10 days.  Take Miralax 1 capful (17 gm) in 8 oz water daily.  YOU HAD AN ENDOSCOPIC PROCEDURE TODAY AT [facility]:   Refer to the procedure report that was given to you for any specific questions about what was found during the examination.  If the procedure report does not answer your questions, please call your gastroenterologist to clarify.  If you requested that your care partner not be given the details of your procedure findings, then the procedure report has been included in a sealed envelope for you to review at your convenience later.  YOU SHOULD EXPECT: Some feelings of bloating in the abdomen. Passage of more gas than usual.  Walking can help get rid of the air that was put into your GI tract during the procedure and reduce the bloating. If you had a lower endoscopy (such as a colonoscopy or flexible sigmoidoscopy) you may notice spotting of blood in your stool or on the toilet paper. If you underwent a bowel prep for your procedure, you may not have a normal bowel movement for a few days.  Please Note:  You might notice some irritation and congestion in your nose or some drainage.  This is from the oxygen used during your procedure.  There is no need for concern and it should clear up in a day or so.  SYMPTOMS TO REPORT IMMEDIATELY:   Following lower endoscopy (colonoscopy or flexible sigmoidoscopy):  Excessive amounts of blood in the stool  Significant tenderness or worsening of abdominal pains  Swelling of the abdomen that is new, acute  Fever of 100F or higher  For urgent or emergent issues, a gastroenterologist can be reached at any hour by calling [phone].   DIET:  We do recommend a small meal at first, but then you may proceed to your regular diet.  Drink plenty of fluids but you should avoid alcoholic beverages for 24 hours.  ACTIVITY:   You should plan to take it easy for the rest of today and you should NOT DRIVE or use heavy machinery until tomorrow (because of the sedation medicines used during the test).    FOLLOW UP: Our staff will call the number listed on your records 48-72 hours following your procedure to check on you and address any questions or concerns that you may have regarding the information given to you following your procedure. If we do not reach you, we will leave a message.  We will attempt to reach you two times.  During this call, we will ask if you have developed any symptoms of COVID 19. If you develop any symptoms (ie: fever, flu-like symptoms, shortness of breath, cough etc.) before then, please call [phone].  If you test positive for Covid 19 in the 2 weeks post procedure, please call and report this information to [place].    If any biopsies were taken you will be contacted by phone or by letter within the next 1-3 weeks.  Please call us at [phone] if you have not heard about the biopsies in 3 weeks.    SIGNATURES/CONFIDENTIALITY: You and/or your care partner have signed paperwork which will be entered into your electronic medical record.  These signatures attest to the fact that that the information above on your After Visit Summary has been reviewed and is understood.  Full responsibility of the confidentiality of this discharge information lies with you  and/or your care-partner. 

## 2019-03-08 NOTE — Progress Notes (Signed)
Called to room to assist during endoscopic procedure.  Patient ID and intended procedure confirmed with present staff. Received instructions for my participation in the procedure from the performing physician.  

## 2019-03-10 ENCOUNTER — Telehealth: Payer: Self-pay | Admitting: *Deleted

## 2019-03-10 ENCOUNTER — Telehealth: Payer: Self-pay

## 2019-03-10 NOTE — Telephone Encounter (Signed)
No answer, left message to call back later today, B.Sharilyn Geisinger RN. 

## 2019-03-10 NOTE — Telephone Encounter (Signed)
Second follow up call attempt.  Left message on voicemail to call if any questions or concerns regarding his procedure or COVID-19 s/sx.

## 2019-03-14 ENCOUNTER — Encounter: Payer: Self-pay | Admitting: Gastroenterology

## 2022-05-09 DIAGNOSIS — R001 Bradycardia, unspecified: Secondary | ICD-10-CM | POA: Diagnosis not present

## 2022-05-09 DIAGNOSIS — R079 Chest pain, unspecified: Secondary | ICD-10-CM

## 2022-05-29 DIAGNOSIS — I10 Essential (primary) hypertension: Secondary | ICD-10-CM | POA: Insufficient documentation

## 2022-05-29 DIAGNOSIS — T7840XA Allergy, unspecified, initial encounter: Secondary | ICD-10-CM | POA: Insufficient documentation

## 2022-05-29 DIAGNOSIS — K219 Gastro-esophageal reflux disease without esophagitis: Secondary | ICD-10-CM | POA: Insufficient documentation

## 2022-05-29 DIAGNOSIS — F419 Anxiety disorder, unspecified: Secondary | ICD-10-CM | POA: Insufficient documentation

## 2022-05-29 DIAGNOSIS — D179 Benign lipomatous neoplasm, unspecified: Secondary | ICD-10-CM | POA: Insufficient documentation

## 2022-06-09 ENCOUNTER — Ambulatory Visit: Payer: BC Managed Care – PPO | Admitting: Cardiology

## 2022-06-18 ENCOUNTER — Other Ambulatory Visit: Payer: Self-pay

## 2022-06-18 DIAGNOSIS — Z6825 Body mass index (BMI) 25.0-25.9, adult: Secondary | ICD-10-CM | POA: Insufficient documentation

## 2022-06-18 DIAGNOSIS — I10 Essential (primary) hypertension: Secondary | ICD-10-CM | POA: Insufficient documentation

## 2022-06-18 DIAGNOSIS — R079 Chest pain, unspecified: Secondary | ICD-10-CM | POA: Insufficient documentation

## 2022-06-18 DIAGNOSIS — E7800 Pure hypercholesterolemia, unspecified: Secondary | ICD-10-CM | POA: Insufficient documentation

## 2022-06-18 DIAGNOSIS — G588 Other specified mononeuropathies: Secondary | ICD-10-CM | POA: Insufficient documentation

## 2022-06-18 NOTE — Progress Notes (Unsigned)
Cardiology Office Note:    Date:  06/19/2022   ID:  [name], DOB 09-Apr-1976, MRN [id number]  PCP:  Mateo Flow, MD  Cardiologist:  Shirlee More, MD   Referring MD: Mateo Flow, MD  ASSESSMENT:    1. Abnormal myocardial perfusion study   2. Primary hypertension   3. Pure hypercholesterolemia    PLAN:    In order of problems listed above:  I interpreted his images as attenuation however I think he requires a better test particularly also to look for aortopathy is a suggestion of some of the skeletal abnormalities seen in these individuals and a brother who has had surgery for pectus excavatum.  He will continue beta-blocker he will continue statin and will be set up in the next few weeks. Stable hypertension and hyperlipidemia  Next appointment   Medication Adjustments/Labs and Tests Ordered: Current medicines are reviewed at length with the patient today.  Concerns regarding medicines are outlined above.  No orders of the defined types were placed in this encounter.  No orders of the defined types were placed in this encounter.    Chief Complaint  Patient presents with   Follow-up   Coronary Artery Disease  My abnormal perfusion images are the reason I am here  History of Present Illness:    Sergio Moore is a 46 y.o. male who is being seen today for the evaluation of recent [facility] admission with chest pain at the request of Mateo Flow, MD.  A myocardial perfusion study interpreted by radiology left ventricular function was normal there is no reversible defect he had fixed defect which was read as scar infarction although there was normal contractility typically this is interpreted as attenuation.  This was performed pharmacologic with adenosine no evidence of ischemia. Echocardiogram at [facility] showed normal left ventricular size function no pattern of previous myocardial infarction his aorta was normal.  There was no valvular  abnormality. He did not have acute coronary syndrome his troponin was undetectable. 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 He did not sustain acute coronary syndrome his echocardiogram was normal and he did not have a myocardial infarction My interpretation of his perfusion study was attenuation otherwise normal Family history is interesting for her grandmother died of sudden death in her early 46s and a brother who has had arrhythmia EP catheter ablation and surgery for pectus excavatum deformity There is no family history of aortopathy or Marfan syndrome He is 6 foot 5 and has pectus excavatum and mild thoracic scoliosis  Because of his presentation abnormal perfusion images and the demand of his high risk career we decided to undergo cardiac CTA it will give [place] a great deal of information coronary artery calcium score presence absence severity of CAD but also be able to evaluate him for aortopathy with scoliosis chest wall deformity and history with his brother. His no dye allergy or renal insufficiency He is no history of congenital or rheumatic heart disease or arrhythmia. 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   Chest pain at rest    Genitofemoral neuralgia, right    GERD (gastroesophageal reflux disease)    Hypertension    hx of   Multiple lipomas    Pure  hypercholesterolemia    S/P bilateral inguinal hernia repair 12/18/2016   Severe right groin pain 12/18/2016    Past Surgical History:  Procedure Laterality Date   cervical interior infusion  08/30/2018   HERNIA REPAIR     bil groin   INGUINAL HERNIA REPAIR Bilateral 12/18/2016   Procedure: REDO RIGHT INGUINAL HERNIA, PERIPHERAL NEURECTOMY;  Surgeon: Michael Boston, MD;  Location: [facility];  Service: General;  Laterality: Bilateral;   INSERTION OF MESH Bilateral 12/18/2016   Procedure: INSERTION OF MESH;  Surgeon: Michael Boston, MD;  Location: [facility];  Service: General;  Laterality: Bilateral;   LAPAROSCOPIC INGUINAL HERNIA REPAIR Right 12/18/2016   Removal of old mesh and placement of new preperitoneal mesh   LAPAROSCOPIC LYSIS OF ADHESIONS N/A 12/18/2016   Procedure: LAPAROSCOPIC LYSIS OF ADHESIONS WITH MESH AND TACK REMOVAL;  Surgeon: Michael Boston, MD;  Location: [facility];  Service: General;  Laterality: N/A;   triple neurectomy - laparoscopic Right 12/18/2016    Current Medications: Current Meds  Medication Sig   ALPRAZolam (XANAX) 1 MG tablet Take 1 mg by mouth 2 (two) times daily as needed for anxiety.   atorvastatin (LIPITOR) 20 MG tablet Take 20 mg by mouth daily.   bisoprolol (ZEBETA) 5 MG tablet Take 2.5 mg by mouth 2 (two) times daily.   gabapentin (NEURONTIN) 300 MG capsule Take 300 mg by mouth daily.   lamoTRIgine (LAMICTAL) 100 MG tablet Take 100 mg by mouth daily.   nitroGLYCERIN (NITROSTAT) 0.4 MG SL tablet Place 0.4 mg under the tongue as needed for chest pain.   traMADol (ULTRAM) 50 MG tablet Take 100 mg by mouth at bedtime as needed for pain.     Allergies:   Patient has no known allergies.   Social History   Socioeconomic History   Marital status: Married    Spouse name: Not on file   Number of children: Not on file   Years of education: Not on file   Highest education level: Not on file  Occupational History   Not on file  Tobacco Use   Smoking status: Former    Smokeless tobacco: Former    Quit date: 12/17/2011   Tobacco comments:    2 years in high school  Vaping Use   Vaping Use: Never used  Substance and Sexual Activity   Alcohol use: No   Drug use: No   Sexual activity: Yes  Other Topics Concern   Not on file  Social History Narrative   Not on file   Social Determinants of Health   Financial Resource Strain: Not on file  Food Insecurity: Not on file  Transportation Needs: Not on file  Physical Activity: Not on file  Stress: Not on file  Social Connections: Not on file     Family History: The patient's family history includes Hypertension in his father and mother. There is no history of Colon cancer, Colon polyps, Esophageal cancer, Rectal cancer, or Stomach cancer.  ROS:   ROS Please see the history of present illness.     All other systems reviewed and are negative.  EKGs/Labs/Other Studies Reviewed:  The following studies were reviewed today:    Physical Exam:    VS:  BP 134/83   Pulse 64   Ht '6\' 6"'$  (1.981 m)   Wt 215 lb (97.5 kg)   SpO2 95%   BMI 24.85 kg/m     Wt Readings from Last 3 Encounters:  06/19/22 215 lb (97.5 kg)  03/08/19 205 lb (93 kg)  02/17/19 205 lb (93 kg)     GEN: Very tall thin stature mild pectus excavatum and thoracic scoliosis well nourished, well developed in no acute distress HEENT: Normal NECK: No JVD; No carotid bruits LYMPHATICS: No lymphadenopathy CARDIAC: RRR, no murmurs, rubs, gallops RESPIRATORY:  Clear to auscultation without rales, wheezing or rhonchi  ABDOMEN: Soft, non-tender, non-distended MUSCULOSKELETAL:  No edema; No deformity  SKIN: Warm and dry NEUROLOGIC:  Alert and oriented x 3 PSYCHIATRIC:  Normal affect     Signed, Shirlee More, MD  06/19/2022 9:25 AM    [facility]

## 2022-06-19 ENCOUNTER — Telehealth (HOSPITAL_COMMUNITY): Payer: Self-pay | Admitting: *Deleted

## 2022-06-19 ENCOUNTER — Encounter: Payer: Self-pay | Admitting: Cardiology

## 2022-06-19 ENCOUNTER — Ambulatory Visit: Payer: BC Managed Care – PPO | Attending: Cardiology | Admitting: Cardiology

## 2022-06-19 VITALS — BP 134/83 | HR 64 | Ht 78.0 in | Wt 215.0 lb

## 2022-06-19 DIAGNOSIS — I10 Essential (primary) hypertension: Secondary | ICD-10-CM | POA: Diagnosis not present

## 2022-06-19 DIAGNOSIS — R9439 Abnormal result of other cardiovascular function study: Secondary | ICD-10-CM

## 2022-06-19 DIAGNOSIS — E7800 Pure hypercholesterolemia, unspecified: Secondary | ICD-10-CM | POA: Diagnosis not present

## 2022-06-19 DIAGNOSIS — R072 Precordial pain: Secondary | ICD-10-CM

## 2022-06-19 NOTE — Patient Instructions (Addendum)
Medication Instructions:  Your physician recommends that you continue on your current medications as directed. Please refer to the Current Medication list given to you today.   *If you need a refill on your cardiac medications before your next appointment, please call your pharmacy*   Lab Work: None ordered  If you have labs (blood work) drawn today and your tests are completely normal, you will receive your results only by: [place] (if you have MyChart) OR A paper copy in the mail If you have any lab test that is abnormal or we need to change your treatment, we will call you to review the results.   Testing/Procedures:   Your cardiac CT will be scheduled at one of the below locations:   [facility] [address] [place], [place] [address] [phone]   At [facility], please arrive at the [facility] and Children's Entrance (Entrance C2) of [facility] 30 minutes prior to test start time. You can use the FREE valet parking offered at entrance C (encouraged to control the heart rate for the test)  Proceed to the [facility] Radiology Department (first floor) to check-in and test prep.  All radiology patients and guests should use entrance C2 at [facility], accessed from [facility], even though the hospital's physical address listed is [address]      Please follow these instructions carefully (unless otherwise directed):  Hold all erectile dysfunction medications at least 3 days (72 hrs) prior to test.  On the Night Before the Test: Be sure to Drink plenty of water. Do not consume any caffeinated/decaffeinated beverages or chocolate 12 hours prior to your test. Do not take any antihistamines 12 hours prior to your test.  On the Day of the Test: Drink plenty of water until 1 hour prior to the test. Do not eat any food 4 hours prior to the test. You may take your regular medications prior to the test.   Take bisorolol (Zebta) two hours prior to test. This is a one time dose.  After the Test: Drink plenty of water. After receiving IV contrast, you may experience a mild flushed feeling. This is normal. On occasion, you may experience a mild rash up to 24 hours after the test. This is not dangerous. If this occurs, you can take Benadryl 25 mg and increase your fluid intake. If you experience trouble breathing, this can be serious. If it is severe call 911 IMMEDIATELY. If it is mild, please call our office. If you take any of these medications: Glipizide/Metformin, Avandament, Glucavance, please do not take 48 hours after completing test unless otherwise instructed.  We will call to schedule your test 2-4 weeks out understanding that some insurance companies will need an authorization prior to the service being performed.   For non-scheduling related questions, please contact the cardiac imaging nurse navigator should you have any questions/concerns: Marchia Bond, Cardiac Imaging Nurse Navigator Gordy Clement, Cardiac Imaging Nurse Navigator [facility] Direct Office Dial: [phone]   For scheduling needs, including cancellations and rescheduling, please call [place], [phone].    Follow-Up: At [facility], you and your health needs are our priority.  As part of our continuing mission to provide you with exceptional heart care, we have created designated Provider Care Teams.  These Care Teams include your primary Cardiologist (physician) and Advanced Practice Providers (APPs -  Physician Assistants and Nurse Practitioners) who all work together to provide you  with the care you need, when you need it.  We recommend signing up for the patient portal called "MyChart".  Sign up information is provided on this After Visit Summary.  MyChart is used to connect with patients for Virtual Visits (Telemedicine).  Patients are able to view lab/test results, encounter  notes, upcoming appointments, etc.  Non-urgent messages can be sent to your provider as well.   To learn more about what you can do with MyChart, go to [email or web address].    Your next appointment:   6-8 week(s)  The format for your next appointment:   In Person  Provider:   Jyl Heinz, MD   Other Instructions Cardiac CT Angiogram A cardiac CT angiogram is a procedure to look at the heart and the area around the heart. It may be done to help find the cause of chest pains or other symptoms of heart disease. During this procedure, a substance called contrast dye is injected into the blood vessels in the area to be checked. A large X-ray machine, called a CT scanner, then takes detailed pictures of the heart and the surrounding area. The procedure is also sometimes called a coronary CT angiogram, coronary artery scanning, or CTA. A cardiac CT angiogram allows the health care provider to see how well blood is flowing to and from the heart. The health care provider will be able to see if there are any problems, such as: Blockage or narrowing of the coronary arteries in the heart. Fluid around the heart. Signs of weakness or disease in the muscles, valves, and tissues of the heart. Tell a health care provider about: Any allergies you have. This is especially important if you have had a previous allergic reaction to contrast dye. All medicines you are taking, including vitamins, herbs, eye drops, creams, and over-the-counter medicines. Any blood disorders you have. Any surgeries you have had. Any medical conditions you have. Whether you are pregnant or may be pregnant. Any anxiety disorders, chronic pain, or other conditions you have that may increase your stress or prevent you from lying still. What are the risks? Generally, this is a safe procedure. However, problems may occur, including: Bleeding. Infection. Allergic reactions to medicines or dyes. Damage to other structures or  organs. Kidney damage from the contrast dye that is used. Increased risk of cancer from radiation exposure. This risk is low. Talk with your health care provider about: The risks and benefits of testing. How you can receive the lowest dose of radiation. What happens before the procedure? Wear comfortable clothing and remove any jewelry, glasses, dentures, and hearing aids. Follow instructions from your health care provider about eating and drinking. This may include: For 12 hours before the procedure -- avoid caffeine. This includes tea, coffee, soda, energy drinks, and diet pills. Drink plenty of water or other fluids that do not have caffeine in them. Being well hydrated can prevent complications. For 4-6 hours before the procedure -- stop eating and drinking. The contrast dye can cause nausea, but this is less likely if your stomach is empty. Ask your health care provider about changing or stopping your regular medicines. This is especially important if you are taking diabetes medicines, blood thinners, or medicines to treat problems with erections (erectile dysfunction). What happens during the procedure?  Hair on your chest may need to be removed so that small sticky patches called electrodes can be placed on your chest. These will transmit information that helps to monitor your heart during the procedure. An  IV will be inserted into one of your veins. You might be given a medicine to control your heart rate during the procedure. This will help to ensure that good images are obtained. You will be asked to lie on an exam table. This table will slide in and out of the CT machine during the procedure. Contrast dye will be injected into the IV. You might feel warm, or you may get a metallic taste in your mouth. You will be given a medicine called nitroglycerin. This will relax or dilate the arteries in your heart. The table that you are lying on will move into the CT machine tunnel for the  scan. The person running the machine will give you instructions while the scans are being done. You may be asked to: Keep your arms above your head. Hold your breath. Stay very still, even if the table is moving. When the scanning is complete, you will be moved out of the machine. The IV will be removed. The procedure may vary among health care providers and hospitals. What can I expect after the procedure? After your procedure, it is common to have: A metallic taste in your mouth from the contrast dye. A feeling of warmth. A headache from the nitroglycerin. Follow these instructions at home: Take over-the-counter and prescription medicines only as told by your health care provider. If you are told, drink enough fluid to keep your urine pale yellow. This will help to flush the contrast dye out of your body. Most people can return to their normal activities right after the procedure. Ask your health care provider what activities are safe for you. It is up to you to get the results of your procedure. Ask your health care provider, or the department that is doing the procedure, when your results will be ready. Keep all follow-up visits as told by your health care provider. This is important. Contact a health care provider if: You have any symptoms of allergy to the contrast dye. These include: Shortness of breath. Rash or hives. A racing heartbeat. Summary A cardiac CT angiogram is a procedure to look at the heart and the area around the heart. It may be done to help find the cause of chest pains or other symptoms of heart disease. During this procedure, a large X-ray machine, called a CT scanner, takes detailed pictures of the heart and the surrounding area after a contrast dye has been injected into blood vessels in the area. Ask your health care provider about changing or stopping your regular medicines before the procedure. This is especially important if you are taking diabetes  medicines, blood thinners, or medicines to treat erectile dysfunction. If you are told, drink enough fluid to keep your urine pale yellow. This will help to flush the contrast dye out of your body. This information is not intended to replace advice given to you by your health care provider. Make sure you discuss any questions you have with your health care provider. Document Revised: 04/13/2019 Document Reviewed: 04/13/2019 Elsevier Patient Education  [place].

## 2022-06-19 NOTE — Telephone Encounter (Signed)
Attempted to call patient regarding upcoming cardiac CT appointment. °Left message on voicemail with name and callback number ° °Etosha Wetherell RN Navigator Cardiac Imaging °[facility] °[phone] Office °[phone] Cell ° °

## 2022-06-20 ENCOUNTER — Ambulatory Visit (HOSPITAL_COMMUNITY)
Admission: RE | Admit: 2022-06-20 | Discharge: 2022-06-20 | Disposition: A | Discharge status: Home / Self Care | Payer: BC Managed Care – PPO | Source: Ambulatory Visit | Attending: Cardiology | Admitting: Cardiology

## 2022-06-20 DIAGNOSIS — R072 Precordial pain: Secondary | ICD-10-CM | POA: Diagnosis not present

## 2022-06-20 DIAGNOSIS — R9439 Abnormal result of other cardiovascular function study: Secondary | ICD-10-CM | POA: Diagnosis not present

## 2022-06-20 MED ORDER — NITROGLYCERIN 0.4 MG SL SUBL: 0.8000 mg | SUBLINGUAL_TABLET | Freq: Once | SUBLINGUAL | Status: AC

## 2022-06-20 MED ORDER — IOHEXOL 350 MG/ML SOLN
100.0000 mL | Freq: Once | INTRAVENOUS | Status: AC | PRN
  Administered 2022-06-20: 100 mL via INTRAVENOUS

## 2022-06-20 MED ORDER — NITROGLYCERIN 0.4 MG SL SUBL
SUBLINGUAL_TABLET | SUBLINGUAL | Status: AC
  Administered 2022-06-20: 0.8 mg via SUBLINGUAL
  Filled 2022-06-20: qty 2

## 2022-08-16 NOTE — Progress Notes (Deleted)
Cardiology Office Note:    Date:  08/16/2022   ID:  Sergio Moore, DOB [birth year], MRN [id number]  PCP:  Mateo Flow, MD  Cardiologist:  Shirlee More, MD    Referring MD: Mateo Flow, MD    ASSESSMENT:    No diagnosis found. PLAN:    In order of problems listed above:  ***   Next appointment: ***   Medication Adjustments/Labs and Tests Ordered: Current medicines are reviewed at length with the patient today.  Concerns regarding medicines are outlined above.  No orders of the defined types were placed in this encounter.  No orders of the defined types were placed in this encounter.   No chief complaint on file.   History of Present Illness:    Sergio Moore is a 46 y.o. male with a hx of chest pain with abnormal myocardial perfusion study fixed defect normal function interpreted by radiology as scar although in my opinion he had normal variant of attenuation performed pharmacologically doubt evidence of acute coronary syndrome last seen 06/19/2022 for evaluation of CAD.  He also had an echocardiogram performed at [facility] at that time which was normal.  No evidence of preceding myocardial infarction.  Chest pain was not anginal in nature.. Compliance with diet, lifestyle and medications: ***  He subsequent underwent cardiac CTA 06/20/2022 showing a coronary calcium score 0 normal coronary arteries and no other thoracic abnormality. Past Medical History:  Diagnosis Date   Allergy    Anxiety    Benign essential hypertension    BMI 25.0-25.9,adult    Cervical spondylosis with myelopathy 08/30/2018   Chest pain at rest    Genitofemoral neuralgia, right    GERD (gastroesophageal reflux disease)    Hypertension    hx of   Multiple lipomas    Pure hypercholesterolemia    S/P bilateral inguinal hernia repair 12/18/2016   Severe right groin pain 12/18/2016    Past Surgical History:  Procedure Laterality Date   cervical interior infusion   08/30/2018   HERNIA REPAIR     bil groin   INGUINAL HERNIA REPAIR Bilateral 12/18/2016   Procedure: REDO RIGHT INGUINAL HERNIA, PERIPHERAL NEURECTOMY;  Surgeon: Michael Boston, MD;  Location: [facility];  Service: General;  Laterality: Bilateral;   INSERTION OF MESH Bilateral 12/18/2016   Procedure: INSERTION OF MESH;  Surgeon: Michael Boston, MD;  Location: [facility];  Service: General;  Laterality: Bilateral;   LAPAROSCOPIC INGUINAL HERNIA REPAIR Right 12/18/2016   Removal of old mesh and placement of new preperitoneal mesh   LAPAROSCOPIC LYSIS OF ADHESIONS N/A 12/18/2016   Procedure: LAPAROSCOPIC LYSIS OF ADHESIONS WITH MESH AND TACK REMOVAL;  Surgeon: Michael Boston, MD;  Location: [facility];  Service: General;  Laterality: N/A;   triple neurectomy - laparoscopic Right 12/18/2016    Current Medications: No outpatient medications have been marked as taking for the 08/18/22 encounter (Appointment) with Richardo Priest, MD.     Allergies:   Patient has no known allergies.   Social History   Socioeconomic History   Marital status: Married    Spouse name: Not on file   Number of children: Not on file   Years of education: Not on file   Highest education level: Not on file  Occupational History   Not on file  Tobacco Use   Smoking status: Former   Smokeless tobacco: Former    Quit date: 12/17/2011   Tobacco comments:    2 years in high school  Vaping Use   Vaping Use: Never used  Substance and Sexual Activity   Alcohol use: No   Drug use: No   Sexual activity: Yes  Other Topics Concern   Not on file  Social History Narrative   Not on file   Social Determinants of Health   Financial Resource Strain: Not on file  Food Insecurity: Not on file  Transportation Needs: Not on file  Physical Activity: Not on file  Stress: Not on file  Social Connections: Not on file     Family History: The patient's ***family history includes Hypertension in his father and mother. There is no history of  Colon cancer, Colon polyps, Esophageal cancer, Rectal cancer, or Stomach cancer. ROS:   Please see the history of present illness.    All other systems reviewed and are negative.  EKGs/Labs/Other Studies Reviewed:    The following studies were reviewed today:  EKG:  EKG ordered today and personally reviewed.  The ekg ordered today demonstrates ***  Recent Labs: No results found for requested labs within last 365 days.  Recent Lipid Panel No results found for: "CHOL", "TRIG", "HDL", "CHOLHDL", "VLDL", "LDLCALC", "LDLDIRECT"  Physical Exam:    VS:  There were no vitals taken for this visit.    Wt Readings from Last 3 Encounters:  06/19/22 215 lb (97.5 kg)  03/08/19 205 lb (93 kg)  02/17/19 205 lb (93 kg)     GEN: *** Well nourished, well developed in no acute distress HEENT: Normal NECK: No JVD; No carotid bruits LYMPHATICS: No lymphadenopathy CARDIAC: ***RRR, no murmurs, rubs, gallops RESPIRATORY:  Clear to auscultation without rales, wheezing or rhonchi  ABDOMEN: Soft, non-tender, non-distended MUSCULOSKELETAL:  No edema; No deformity  SKIN: Warm and dry NEUROLOGIC:  Alert and oriented x 3 PSYCHIATRIC:  Normal affect    Signed, Shirlee More, MD  08/16/2022 10:27 AM    [place]

## 2022-08-18 ENCOUNTER — Ambulatory Visit: Payer: BC Managed Care – PPO | Attending: Cardiology | Admitting: Cardiology
